# Patient Record
Sex: Male | Born: 1960 | Race: Asian | Hispanic: Yes | Marital: Married | Smoking: Former smoker
Health system: Southern US, Community
[De-identification: ages and names within clinical notes are randomized; demographics above are authoritative.]

## PROBLEM LIST (undated history)

## (undated) DIAGNOSIS — E119 Type 2 diabetes mellitus without complications: Secondary | ICD-10-CM

## (undated) DIAGNOSIS — D649 Anemia, unspecified: Secondary | ICD-10-CM

## (undated) DIAGNOSIS — I10 Essential (primary) hypertension: Secondary | ICD-10-CM

## (undated) HISTORY — PX: EYE SURGERY: SHX253

---

## 2004-06-29 ENCOUNTER — Emergency Department: Payer: Self-pay | Admitting: Emergency Medicine

## 2005-09-21 ENCOUNTER — Ambulatory Visit: Payer: Self-pay | Admitting: *Deleted

## 2011-10-25 ENCOUNTER — Emergency Department: Payer: Self-pay | Admitting: Emergency Medicine

## 2011-10-25 LAB — COMPREHENSIVE METABOLIC PANEL
Albumin: 4 g/dL (ref 3.4–5.0)
Alkaline Phosphatase: 67 U/L (ref 50–136)
Anion Gap: 11 (ref 7–16)
BUN: 20 mg/dL — ABNORMAL HIGH (ref 7–18)
Chloride: 102 mmol/L (ref 98–107)
Co2: 24 mmol/L (ref 21–32)
Creatinine: 0.82 mg/dL (ref 0.60–1.30)
EGFR (African American): >60
EGFR (Non-African Amer.): >60
Glucose: 195 mg/dL — ABNORMAL HIGH (ref 65–99)
Osmolality: 282 (ref 275–301)
SGOT(AST): 29 U/L (ref 15–37)
Sodium: 137 mmol/L (ref 136–145)

## 2011-10-25 LAB — CBC
HCT: 39.4 % — ABNORMAL LOW (ref 40.0–52.0)
HGB: 14 g/dL (ref 13.0–18.0)
MCH: 29.3 pg (ref 26.0–34.0)
MCV: 82 fL (ref 80–100)
Platelet: 123 10*3/uL — ABNORMAL LOW (ref 150–440)
RBC: 4.79 10*6/uL (ref 4.40–5.90)
WBC: 9.3 10*3/uL (ref 3.8–10.6)

## 2011-10-25 LAB — URINALYSIS, COMPLETE
Bacteria: NONE SEEN
Bilirubin,UR: NEGATIVE
Blood: NEGATIVE
Leukocyte Esterase: NEGATIVE
Nitrite: NEGATIVE
Ph: 5 (ref 4.5–8.0)
RBC,UR: NONE SEEN /HPF (ref 0–5)
Squamous Epithelial: NONE SEEN
WBC UR: <1 /HPF (ref 0–5)

## 2011-10-25 LAB — TROPONIN I: Troponin-I: <0.02 ng/mL

## 2014-05-21 ENCOUNTER — Ambulatory Visit
Admit: 2014-05-21 | Disposition: A | Discharge status: Home / Self Care | Payer: Self-pay | Attending: Ophthalmology | Admitting: Ophthalmology

## 2014-05-21 LAB — POTASSIUM: POTASSIUM: 4.3 mmol/L

## 2014-05-29 ENCOUNTER — Ambulatory Visit
Admit: 2014-05-29 | Disposition: A | Discharge status: Home / Self Care | Payer: Self-pay | Attending: Ophthalmology | Admitting: Ophthalmology

## 2014-06-08 NOTE — Op Note (Signed)
PATIENT NAME:  Ian Mccormick, SWOPES MR#:  [id number] DATE OF BIRTH:  1960-05-25  DATE OF PROCEDURE:  05/29/2014.  PREOPERATIVE DIAGNOSIS:  Nuclear sclerotic cataract, right eye, ICD-10 code H25.11.  POSTOPERATIVE DIAGNOSIS:  Nuclear sclerotic cataract, right eye, ICD-10 code H25.11  PROCEDURE:  Phacoemulsification with posterior chamber intraocular lens right eye, model SN60WF.  SURGEON:  Lyla Glassing, MD.  INDICATIONS:  This is a 54 year old male with decreased vision in the right eye.  PROCEDURE:  The risks and benefits of cataract surgery were discussed at length with the patient, including bleeding, infection, retinal detachment, re-operation, diplopia, ptosis, loss of vision, and loss of the eye. Informed consent was obtained. On the day of surgery, several sets of preoperative medication were administered to the operative eye including 0.5% tetracaine,1% cyclopentolate, 10% phenylephrine, 0.5% ketorolac, 0.5% gatifloxacin, and 2% lidocaine .  The patient was taken to the operating room and sedated via IV sedation. Topical tetracaine was placed in the eye. The operative eye was prepped using a dilute 10% Betadine solution and then covered in sterile drapes leaving only the operative eye exposed. A Lieberman lid speculum was placed to provide exposure. Using 0.12 forceps and a sideport blade, a paracentesis was created. Then a mixture of BSS, preservative free lidocaine, and epinephrine was injected into the anterior chamber. Next, a 2.4 mm keratome blade was used to create a two-step full-thickness clear corneal incision temporally. The cystitome and Utrata forceps were used to create a continuous capsulorrhexis in the anterior lens capsule. BSS on a hydrodissection cannula was used to perform gentle hydrodissection. Phacoemulsification was then performed to remove the nucleus. Irrigation and aspiration was performed to remove the remaining cortical material. Provisc was injected to fill the capsular  bag and anterior chamber. A 19.0-diopter SN60WF intraocular lens was injected into the capsular bag. The Connor wand was used to rotate it into proper position in the capsular bag. Irrigation and aspiration was performed to remove the remaining Viscoelastic material from the eye. BSS on a 30-gauge cannula was used to hydrate the wound. An intracameral antibiotic was administered. The wounds were checked and found to be watertight. The lid speculum and drapes were carefully removed. Several drops of Vigamox were placed in the operative eye. The eye was covered with protective eyewear. The patient was taken to the recovery area in good condition. There were no complications.   ____________________________ Lyla Glassing, MD nm:kc D: 05/29/2014 11:48:19 ET T: 05/29/2014 14:42:46 ET JOB#: [id number]  cc: Lyla Glassing, MD, <Dictator> Lyla Glassing MD ELECTRONICALLY SIGNED 06/05/2014 11:12

## 2015-02-12 ENCOUNTER — Emergency Department (HOSPITAL_COMMUNITY): Payer: BLUE CROSS/BLUE SHIELD

## 2015-02-12 ENCOUNTER — Emergency Department (HOSPITAL_COMMUNITY)
Admission: EM | Admit: 2015-02-12 | Discharge: 2015-02-12 | Disposition: A | Discharge status: Home / Self Care | Payer: BLUE CROSS/BLUE SHIELD | Attending: Emergency Medicine | Admitting: Emergency Medicine

## 2015-02-12 ENCOUNTER — Encounter (HOSPITAL_COMMUNITY): Payer: Self-pay | Admitting: *Deleted

## 2015-02-12 DIAGNOSIS — E119 Type 2 diabetes mellitus without complications: Secondary | ICD-10-CM | POA: Insufficient documentation

## 2015-02-12 DIAGNOSIS — J988 Other specified respiratory disorders: Secondary | ICD-10-CM

## 2015-02-12 DIAGNOSIS — I10 Essential (primary) hypertension: Secondary | ICD-10-CM | POA: Diagnosis not present

## 2015-02-12 DIAGNOSIS — B9789 Other viral agents as the cause of diseases classified elsewhere: Secondary | ICD-10-CM

## 2015-02-12 DIAGNOSIS — J029 Acute pharyngitis, unspecified: Secondary | ICD-10-CM | POA: Diagnosis present

## 2015-02-12 DIAGNOSIS — R059 Cough, unspecified: Secondary | ICD-10-CM

## 2015-02-12 DIAGNOSIS — J069 Acute upper respiratory infection, unspecified: Secondary | ICD-10-CM | POA: Diagnosis not present

## 2015-02-12 DIAGNOSIS — R05 Cough: Secondary | ICD-10-CM

## 2015-02-12 HISTORY — DX: Essential (primary) hypertension: I10

## 2015-02-12 HISTORY — DX: Type 2 diabetes mellitus without complications: E11.9

## 2015-02-12 LAB — URINALYSIS, ROUTINE W REFLEX MICROSCOPIC
BILIRUBIN URINE: NEGATIVE
Glucose, UA: >1000 mg/dL — AB
HGB URINE DIPSTICK: NEGATIVE
KETONES UR: NEGATIVE mg/dL
Leukocytes, UA: NEGATIVE
NITRITE: NEGATIVE
Protein, ur: 30 mg/dL — AB
SPECIFIC GRAVITY, URINE: 1.033 — AB (ref 1.005–1.030)
pH: 6 (ref 5.0–8.0)

## 2015-02-12 LAB — COMPREHENSIVE METABOLIC PANEL
ALK PHOS: 90 U/L (ref 38–126)
ALT: 27 U/L (ref 17–63)
AST: 27 U/L (ref 15–41)
Albumin: 3.4 g/dL — ABNORMAL LOW (ref 3.5–5.0)
Anion gap: 9 (ref 5–15)
BILIRUBIN TOTAL: 0.5 mg/dL (ref 0.3–1.2)
BUN: 18 mg/dL (ref 6–20)
CALCIUM: 9.5 mg/dL (ref 8.9–10.3)
CO2: 26 mmol/L (ref 22–32)
CREATININE: 0.89 mg/dL (ref 0.61–1.24)
Chloride: 101 mmol/L (ref 101–111)
Glucose, Bld: 300 mg/dL — ABNORMAL HIGH (ref 65–99)
Potassium: 4 mmol/L (ref 3.5–5.1)
Sodium: 136 mmol/L (ref 135–145)
Total Protein: 6.7 g/dL (ref 6.5–8.1)

## 2015-02-12 LAB — CBC WITH DIFFERENTIAL/PLATELET
BASOS ABS: 0.1 10*3/uL (ref 0.0–0.1)
Basophils Relative: 1 %
EOS PCT: 13 %
Eosinophils Absolute: 0.9 10*3/uL — ABNORMAL HIGH (ref 0.0–0.7)
HEMATOCRIT: 38.3 % — AB (ref 39.0–52.0)
HEMOGLOBIN: 13 g/dL (ref 13.0–17.0)
LYMPHS ABS: 2 10*3/uL (ref 0.7–4.0)
LYMPHS PCT: 28 %
MCH: 26 pg (ref 26.0–34.0)
MCHC: 33.9 g/dL (ref 30.0–36.0)
MCV: 76.6 fL — AB (ref 78.0–100.0)
Monocytes Absolute: 0.5 10*3/uL (ref 0.1–1.0)
Monocytes Relative: 7 %
NEUTROS ABS: 3.7 10*3/uL (ref 1.7–7.7)
Neutrophils Relative %: 51 %
Platelets: 140 10*3/uL — ABNORMAL LOW (ref 150–400)
RBC: 5 MIL/uL (ref 4.22–5.81)
RDW: 13.4 % (ref 11.5–15.5)
WBC: 7.1 10*3/uL (ref 4.0–10.5)

## 2015-02-12 LAB — URINE MICROSCOPIC-ADD ON

## 2015-02-12 MED ORDER — ALBUTEROL SULFATE HFA 108 (90 BASE) MCG/ACT IN AERS
2.0000 | INHALATION_SPRAY | Freq: Once | RESPIRATORY_TRACT | Status: AC
  Administered 2015-02-12: 2 via RESPIRATORY_TRACT
  Filled 2015-02-12: qty 6.7

## 2015-02-12 MED ORDER — IBUPROFEN 400 MG PO TABS
600.0000 mg | ORAL_TABLET | Freq: Once | ORAL | Status: AC
  Administered 2015-02-12: 600 mg via ORAL
  Filled 2015-02-12: qty 1

## 2015-02-12 NOTE — ED Notes (Signed)
PT ambulated with baseline gait; VSS; A&Ox3; no signs of distress; respirations even and unlabored; skin warm and dry; no questions upon discharge.  

## 2015-02-12 NOTE — ED Notes (Signed)
[place] interpreters used for questioning: pt reports sore throat, cough, headache x 2 weeks; states co-workers also exhibiting same symptoms; unknown if patient received flu immunization; pt reports headache is 3/10 but worse with coughing; productive cough noted with yellow phlem

## 2015-02-12 NOTE — ED Notes (Signed)
Per Spanish Interpreter: Pt to ED c/o headache, bodyaches, and sore throat x 2 weeks; has tried nyquil/dayquil without relief. Reports productive cough with clear and yellow phlegm. Denies fever or chlls

## 2015-02-12 NOTE — Discharge Instructions (Signed)
Tos en los adultos (Cough, Adult) La tos ayuda a limpiar la garganta y los pulmones. La tos puede durar solo 2 o 3semanas (aguda) o ms de 8semanas (crnica). Las causas de la tos son [place]. Puede ser el signo de [place] enfermedad o de otro trastorno. CUIDADOS EN EL HOGAR  Est atento a cualquier cambio en la tos.  Tome los medicamentos solamente como se lo haya indicado el mdico.  Si le recetaron un antibitico, tmelo como se lo haya indicado el mdico. No deje de tomarlo aunque comience a sentirse mejor.  Hable con el mdico antes de probar un medicamento para la tos.  Beba suficiente lquido para mantener el pis (orina) claro o de color amarillo plido.  Si el aire est seco, use un vaporizador o un humidificador con vapor fro en su casa.  Mantngase alejado de las cosas que lo hacen toser en el trabajo o en su casa.  Si la tos aumenta durante la noche, haga la prueba de usar almohadas adicionales para Theatre manager la cabeza ms elevada mientras duerme.  No fume e intente no estar cerca de humo. Si necesita ayuda para dejar de fumar, consulte al mdico.  No consuma cafena.  No beba alcohol.  Descanse todo lo que sea necesario. SOLICITE AYUDA SI:  Le aparecen problemas (sntomas) nuevos.  Expectora un lquido amarillento (pus) cuando tose.  La tos no mejora despus de 2 o 3semanas, o empeora.  Los medicamentos no [employer] tos, y no Programmer, applications.  Siente un dolor que se vuelve ms intenso o que no se [employer].  Tiene fiebre.  Est bajando de peso y no sabe por qu.  Tiene transpiracin nocturna. SOLICITE AYUDA DE INMEDIATO SI:  Tose y escupe sangre.  Tiene dificultad para respirar.  Los latidos cardacos son muy rpidos.   Esta informacin no tiene Marine scientist el consejo del mdico. Asegrese de hacerle al mdico cualquier pregunta que tenga.   Document Released: 10/07/2010 Document Revised: 10/15/2014 Elsevier Interactive  Patient Education 2016 [place]  (Viral Infections)  Un virus es un tipo de germen. Puede causar:   Dolor de garganta leve.  Dolores musculares.  Dolor de [place].  Secrecin nasal.  Erupciones.  Lagrimeo.  Cansancio.  Tos.  Prdida del apetito.  Ganas de vomitar (nuseas).  Vmitos.  Materia fecal lquida (diarrea). CUIDADOS EN EL HOGAR   Tome la medicacin slo como le haya indicado el mdico.  Beba gran cantidad de lquido para mantener la orina de tono claro o color amarillo plido. Las bebidas deportivas son Pamala Hurry eleccin.  Descanse lo suficiente y [employer]. Puede tomar sopas y caldos con crackers o arroz. SOLICITE AYUDA DE INMEDIATO SI:   Siente un dolor de cabeza muy intenso.  Le falta el aire.  Tiene dolor en el pecho o en el cuello.  Tiene una erupcin que no tena antes.  No puede detener los vmitos.  Tiene una hemorragia que no se detiene.  No puede retener los lquidos.  Usted o el nio tienen una temperatura oral le sube a ms de 38,9 C (102 F), y no puede bajarla con medicamentos.  Su beb tiene ms de 3 meses y su temperatura rectal es de 102 F (38.9 C) o ms.  Su beb tiene 3 meses o menos y su temperatura rectal es de 100.4 F (38 C) o ms. ASEGRESE DE QUE:   Comprende estas instrucciones.  Controlar la enfermedad.  Solicitar  ayuda de inmediato si no mejora o si empeora.   Esta informacin no tiene Marine scientist el consejo del mdico. Asegrese de hacerle al mdico cualquier pregunta que tenga.   Document Released: 06/28/2010 Document Revised: 04/18/2011 Elsevier Interactive Patient Education [employer].

## 2015-02-12 NOTE — ED Notes (Signed)
Pt ambulatory to room B14 with steady gait

## 2015-02-12 NOTE — ED Provider Notes (Signed)
CSN: [id number]     Arrival date & time 02/12/15  0534 History   First MD Initiated Contact with Patient 02/12/15 [phone]     Chief Complaint  Patient presents with  . Sore Throat  . Headache  . Cough     (Consider location/radiation/quality/duration/timing/severity/associated sxs/prior Treatment) HPI  History is obtained via [place] interpreter. 55 year old male with history of HTN and DM who presents with cough, sore throat, and headaches. Two weeks ago with progressive symptoms, including myalgias, productive cough, congestions, sore throat, runny nose, and intermittent HA. Nausea and intermittent abdominal discomfort. No vomiting or diarrhea. No chest pain or difficulty breathing. Not improving so presented to ED for evaluation. Urinating frequenting but no dysuria, fever, or chills. Multiple people at work with similar illness.  Past Medical History  Diagnosis Date  . Diabetes mellitus without complication ([place])   . Hypertension    Past Surgical History  Procedure Laterality Date  . Eye surgery     History reviewed. No pertinent family history. Social History  Substance Use Topics  . Smoking status: Never Smoker   . Smokeless tobacco: None  . Alcohol Use: No    Review of Systems 10/14 systems reviewed and are negative other than those stated in the HPI    Allergies  Review of patient's allergies indicates no known allergies.  Home Medications   Prior to Admission medications   Not on File   BP 124/73 mmHg  Pulse 66  Temp(Src) 98 F (36.7 C)  Resp 16  Ht 5\' 5"  (1.651 m)  Wt 170 lb (77.111 kg)  BMI 28.29 kg/m2  SpO2 99% Physical Exam Physical Exam  Nursing note and vitals reviewed. Constitutional: Well developed, well nourished, non-toxic, and in no acute distress Head: Normocephalic and atraumatic.  Mouth/Throat: Oropharynx is erythematous but no swelling or lesions. Mucous membranes moist.  Neck: Normal range of motion. Neck supple.  no  meningismus. Cardiovascular: Normal rate and regular rhythm.   Pulmonary/Chest: Effort normal and breath sounds normal. Bronchospastic cough. Abdominal: Soft. There is no tenderness. There is no rebound and no guarding.  Musculoskeletal: Normal range of motion.  Neurological: Alert, no facial droop, fluent speech, moves all extremities symmetrically Skin: Skin is warm and dry.  Psychiatric: Cooperative  ED Course  Procedures (including critical care time) Labs Review Labs Reviewed  CBC WITH DIFFERENTIAL/PLATELET - Abnormal; Notable for the following:    HCT 38.3 (*)    MCV 76.6 (*)    Platelets 140 (*)    Eosinophils Absolute 0.9 (*)    All other components within normal limits  COMPREHENSIVE METABOLIC PANEL - Abnormal; Notable for the following:    Glucose, Bld 300 (*)    Albumin 3.4 (*)    All other components within normal limits  URINALYSIS, ROUTINE W REFLEX MICROSCOPIC (NOT AT [facility]) - Abnormal; Notable for the following:    Specific Gravity, Urine 1.033 (*)    Glucose, UA >1000 (*)    Protein, ur 30 (*)    All other components within normal limits  URINE MICROSCOPIC-ADD ON - Abnormal; Notable for the following:    Squamous Epithelial / LPF 0-5 (*)    Bacteria, UA RARE (*)    All other components within normal limits    Imaging Review Dg Chest 2 View  02/12/2015  CLINICAL DATA:  Subacute onset of cough.  Initial encounter. EXAM: CHEST  2 VIEW COMPARISON:  None. FINDINGS: The lungs are well-aerated. Mild peribronchial thickening is noted. There is  no evidence of focal opacification, pleural effusion or pneumothorax. The heart is normal in size; the mediastinal contour is within normal limits. No acute osseous abnormalities are seen. IMPRESSION: Mild peribronchial thickening noted.  Lungs otherwise clear. Electronically Signed   By: Garald Balding M.D.   On: 02/12/2015 06:28   I have personally reviewed and evaluated these images and lab results as part of my medical  decision-making.   EKG Interpretation None      MDM   Final diagnoses:  Cough  Viral respiratory illness    55 year old male with history of hypertension and diabetes who presents with a 2 weeks of progressive cough, congestion, sore throat and runny nose. Vital signs are stable on arrival, and he is well-appearing and in no acute distress. Exam overall unremarkable. X-ray without evidence of pneumonia but mild peribronchial thickening. Overall presentation is consistent with likely viral respiratory illness. Basic blood work overall unremarkable aside from hyperglycemia without evidence of DKA or other complications. Has been compliant with medications, and may have hyperglycemia due to recent illness. He has a PCP so will follow-up regarding his blood glucose and repeat evaluation. Discussed supportive care for home. Strict return and follow-up instructions are reviewed. These are all reviewed via a Spanish interpreter. He expressed understanding of all discharge instructions and felt comfortable to plan of care.    Forde Dandy, MD 02/12/15 1046

## 2016-12-06 ENCOUNTER — Ambulatory Visit: Payer: Self-pay

## 2020-02-15 ENCOUNTER — Other Ambulatory Visit: Payer: BC Managed Care – PPO

## 2020-02-15 DIAGNOSIS — Z20822 Contact with and (suspected) exposure to covid-19: Secondary | ICD-10-CM

## 2020-02-17 LAB — SARS-COV-2, NAA 2 DAY TAT

## 2020-02-17 LAB — NOVEL CORONAVIRUS, NAA: SARS-CoV-2, NAA: DETECTED — AB

## 2020-02-21 ENCOUNTER — Telehealth: Payer: Self-pay

## 2020-02-21 NOTE — Telephone Encounter (Signed)
See lab note. COVID-19 positive result.

## 2020-08-27 ENCOUNTER — Inpatient Hospital Stay: Payer: BC Managed Care – PPO | Admitting: Oncology

## 2020-08-27 ENCOUNTER — Inpatient Hospital Stay: Payer: BC Managed Care – PPO

## 2020-09-01 ENCOUNTER — Encounter: Payer: Self-pay | Admitting: Oncology

## 2020-09-01 ENCOUNTER — Inpatient Hospital Stay: Payer: BC Managed Care – PPO

## 2020-09-01 ENCOUNTER — Other Ambulatory Visit: Payer: Self-pay

## 2020-09-01 ENCOUNTER — Other Ambulatory Visit: Payer: Self-pay | Admitting: *Deleted

## 2020-09-01 ENCOUNTER — Inpatient Hospital Stay: Payer: BC Managed Care – PPO | Attending: Oncology | Admitting: Oncology

## 2020-09-01 DIAGNOSIS — D509 Iron deficiency anemia, unspecified: Secondary | ICD-10-CM

## 2020-09-01 DIAGNOSIS — Z87891 Personal history of nicotine dependence: Secondary | ICD-10-CM | POA: Diagnosis not present

## 2020-09-01 DIAGNOSIS — E785 Hyperlipidemia, unspecified: Secondary | ICD-10-CM | POA: Diagnosis not present

## 2020-09-01 DIAGNOSIS — E7800 Pure hypercholesterolemia, unspecified: Secondary | ICD-10-CM | POA: Insufficient documentation

## 2020-09-01 DIAGNOSIS — D472 Monoclonal gammopathy: Secondary | ICD-10-CM

## 2020-09-01 DIAGNOSIS — D649 Anemia, unspecified: Secondary | ICD-10-CM | POA: Diagnosis not present

## 2020-09-01 DIAGNOSIS — I10 Essential (primary) hypertension: Secondary | ICD-10-CM | POA: Diagnosis not present

## 2020-09-01 DIAGNOSIS — E119 Type 2 diabetes mellitus without complications: Secondary | ICD-10-CM | POA: Insufficient documentation

## 2020-09-01 LAB — IRON AND TIBC
Iron: 43 ug/dL — ABNORMAL LOW (ref 45–182)
Saturation Ratios: 10 % — ABNORMAL LOW (ref 17.9–39.5)
TIBC: 440 ug/dL (ref 250–450)
UIBC: 397 ug/dL

## 2020-09-01 LAB — CBC WITH DIFFERENTIAL/PLATELET
Abs Immature Granulocytes: 0.05 10*3/uL (ref 0.00–0.07)
Basophils Absolute: 0.1 10*3/uL (ref 0.0–0.1)
Basophils Relative: 1 %
Eosinophils Absolute: 0.7 10*3/uL — ABNORMAL HIGH (ref 0.0–0.5)
Eosinophils Relative: 7 %
HCT: 35.3 % — ABNORMAL LOW (ref 39.0–52.0)
Hemoglobin: 11 g/dL — ABNORMAL LOW (ref 13.0–17.0)
Immature Granulocytes: 1 %
Lymphocytes Relative: 23 %
Lymphs Abs: 2.1 10*3/uL (ref 0.7–4.0)
MCH: 22.7 pg — ABNORMAL LOW (ref 26.0–34.0)
MCHC: 31.2 g/dL (ref 30.0–36.0)
MCV: 72.9 fL — ABNORMAL LOW (ref 80.0–100.0)
Monocytes Absolute: 0.6 10*3/uL (ref 0.1–1.0)
Monocytes Relative: 6 %
Neutro Abs: 5.8 10*3/uL (ref 1.7–7.7)
Neutrophils Relative %: 62 %
Platelets: 197 10*3/uL (ref 150–400)
RBC: 4.84 MIL/uL (ref 4.22–5.81)
RDW: 15.4 % (ref 11.5–15.5)
WBC: 9.2 10*3/uL (ref 4.0–10.5)
nRBC: 0 % (ref 0.0–0.2)

## 2020-09-01 LAB — COMPREHENSIVE METABOLIC PANEL
ALT: 18 U/L (ref 0–44)
AST: 21 U/L (ref 15–41)
Albumin: 4.1 g/dL (ref 3.5–5.0)
Alkaline Phosphatase: 72 U/L (ref 38–126)
Anion gap: 9 (ref 5–15)
BUN: 30 mg/dL — ABNORMAL HIGH (ref 8–23)
CO2: 24 mmol/L (ref 22–32)
Calcium: 9.5 mg/dL (ref 8.9–10.3)
Chloride: 100 mmol/L (ref 98–111)
Creatinine, Ser: 1.42 mg/dL — ABNORMAL HIGH (ref 0.61–1.24)
GFR, Estimated: 56 mL/min — ABNORMAL LOW (ref >60)
Glucose, Bld: 112 mg/dL — ABNORMAL HIGH (ref 70–99)
Potassium: 3.9 mmol/L (ref 3.5–5.1)
Sodium: 133 mmol/L — ABNORMAL LOW (ref 135–145)
Total Bilirubin: 0.3 mg/dL (ref 0.3–1.2)
Total Protein: 7.5 g/dL (ref 6.5–8.1)

## 2020-09-01 LAB — TSH: TSH: 4.055 u[IU]/mL (ref 0.350–4.500)

## 2020-09-01 LAB — FERRITIN: Ferritin: 7 ng/mL — ABNORMAL LOW (ref 24–336)

## 2020-09-01 LAB — FOLATE: Folate: 21 ng/mL (ref >5.9)

## 2020-09-01 LAB — RETICULOCYTES
Immature Retic Fract: 22.7 % — ABNORMAL HIGH (ref 2.3–15.9)
RBC.: 4.89 MIL/uL (ref 4.22–5.81)
Retic Count, Absolute: 68 10*3/uL (ref 19.0–186.0)
Retic Ct Pct: 1.4 % (ref 0.4–3.1)

## 2020-09-01 LAB — VITAMIN B12: Vitamin B-12: 271 pg/mL (ref 180–914)

## 2020-09-01 NOTE — Progress Notes (Signed)
Hematology/Oncology Consult note [facility] [phone] [phone]  Patient Care Team: Frazier Richards, MD as PCP - General (Family Medicine)   Name of the patient: Ian Mccormick  [id number]  1960-07-04    Reason for referral-anemia   Referring physician- Beverlyn Roux  Date of visit: 09/01/20   History of presenting illness-history obtained with the help of a Spanish interpreter.  Patient is a 60 year old Hispanic male with a past medical history significant for hypertension hyperlipidemia and type 2 diabetes and CKD.  He has been referred to [place] for anemia.Most recent CBC from July 2022 showed white count of 8.7, H&H of 10.9/35.6 with an MCV of 75.  I do not have any iron studies.  Creatinine mildly elevated at 1.2.  Patient denies any bleeding in his stool or urine.  Denies any dark melanotic stools.  Denies any consistent use of NSAIDs Goody powder or BC powder.  Denies any family history of colon cancer.  He has never had a colonoscopy in the past.  Currently reports ongoing fatigue.  Denies any changes in his appetite or weight.  Reports ongoing constipation  ECOG PS- 0  Pain scale- 0   Review of systems- Review of Systems  Constitutional:  Positive for malaise/fatigue. Negative for chills, fever and weight loss.  HENT:  Negative for congestion, ear discharge and nosebleeds.   Eyes:  Negative for blurred vision.  Respiratory:  Negative for cough, hemoptysis, sputum production, shortness of breath and wheezing.   Cardiovascular:  Negative for chest pain, palpitations, orthopnea and claudication.  Gastrointestinal:  Negative for abdominal pain, blood in stool, constipation, diarrhea, heartburn, melena, nausea and vomiting.  Genitourinary:  Negative for dysuria, flank pain, frequency, hematuria and urgency.  Musculoskeletal:  Negative for back pain, joint pain and myalgias.  Skin:  Negative for rash.  Neurological:  Negative for dizziness,  tingling, focal weakness, seizures, weakness and headaches.  Endo/Heme/Allergies:  Does not bruise/bleed easily.  Psychiatric/Behavioral:  Negative for depression and suicidal ideas. The patient does not have insomnia.    No Known Allergies  Patient Active Problem List   Diagnosis Date Noted   Diabetes ([place]) 09/01/2020   Essential hypertension 09/01/2020   High cholesterol 09/01/2020     Past Medical History:  Diagnosis Date   Diabetes mellitus without complication ([place])    Hypertension      Past Surgical History:  Procedure Laterality Date   EYE SURGERY      Social History   Socioeconomic History   Marital status: Married    Spouse name: Not on file   Number of children: Not on file   Years of education: Not on file   Highest education level: Not on file  Occupational History   Not on file  Tobacco Use   Smoking status: Former    Packs/day: 1.00    Years: 4.00    Pack years: 4.00    Types: Cigarettes    Quit date: 2000    Years since quitting: 22.5   Smokeless tobacco: Never  Vaping Use   Vaping Use: Former  Substance and Sexual Activity   Alcohol use: No   Drug use: No   Sexual activity: Not on file  Other Topics Concern   Not on file  Social History Narrative   Not on file   Social Determinants of Health   Financial Resource Strain: Not on file  Food Insecurity: Not on file  Transportation Needs: Not on file  Physical Activity: Not  on file  Stress: Not on file  Social Connections: Not on file  Intimate Partner Violence: Not on file     Family History  Problem Relation Age of Onset   Hypertension Mother    Diabetes Mother      Current Outpatient Medications:    aspirin EC 81 MG tablet, Take 81 mg by mouth daily. Swallow whole., Disp: , Rfl:    hydrochlorothiazide (HYDRODIURIL) 25 MG tablet, Take 25 mg by mouth daily., Disp: , Rfl:    JARDIANCE 25 MG TABS tablet, Take 25 mg by mouth daily., Disp: , Rfl:    losartan (COZAAR) 100 MG tablet,  Take 100 mg by mouth daily., Disp: , Rfl:    metFORMIN (GLUCOPHAGE) 1000 MG tablet, Take 1,000 mg by mouth 2 (two) times daily., Disp: , Rfl:    metoprolol (TOPROL-XL) 200 MG 24 hr tablet, Take 200 mg by mouth daily., Disp: , Rfl:    rosuvastatin (CRESTOR) 20 MG tablet, SMARTSIG:1 Tablet(s) By Mouth Every Evening, Disp: , Rfl:    Physical exam:  Vitals:   09/01/20 1335  BP: (!) 183/88  Pulse: (!) 59  Resp: 20  Temp: 97.9 F (36.6 C)  TempSrc: Tympanic  SpO2: 100%  Weight: 176 lb 14.4 oz (80.2 kg)   Physical Exam Constitutional:      General: He is not in acute distress. Cardiovascular:     Rate and Rhythm: Normal rate and regular rhythm.     Heart sounds: Normal heart sounds.  Pulmonary:     Effort: Pulmonary effort is normal.     Breath sounds: Normal breath sounds.  Abdominal:     General: Bowel sounds are normal.     Palpations: Abdomen is soft.  Skin:    General: Skin is warm and dry.  Neurological:     Mental Status: He is alert and oriented to person, place, and time.       CMP Latest Ref Rng & Units 09/01/2020  Glucose 70 - 99 mg/dL 112(H)  BUN 8 - 23 mg/dL 30(H)  Creatinine 0.61 - 1.24 mg/dL 1.42(H)  Sodium 135 - 145 mmol/L 133(L)  Potassium 3.5 - 5.1 mmol/L 3.9  Chloride 98 - 111 mmol/L 100  CO2 22 - 32 mmol/L 24  Calcium 8.9 - 10.3 mg/dL 9.5  Total Protein 6.5 - 8.1 g/dL 7.5  Total Bilirubin 0.3 - 1.2 mg/dL 0.3  Alkaline Phos 38 - 126 U/L 72  AST 15 - 41 U/L 21  ALT 0 - 44 U/L 18   CBC Latest Ref Rng & Units 09/01/2020  WBC 4.0 - 10.5 K/uL 9.2  Hemoglobin 13.0 - 17.0 g/dL 11.0(L)  Hematocrit 39.0 - 52.0 % 35.3(L)  Platelets 150 - 400 K/uL 197    Assessment and plan- Patient is a 60 y.o. male referred for normocytic anemia  Based on patient's recent blood work he has mild to moderate anemia with borderline microcytosis concerning for iron deficiency.Today I will do a complete anemia work-up including a CBC, CMP, ferritin and iron studies B12 and  folate, myeloma panel reticulocyte count TSH and haptoglobin.  If he has evidence of iron deficiency we can offer him IV iron.  Patient has never had a colonoscopy and I will refer him to GI as well if he has evidence of iron deficiency.  I will see him in person in 3 weeks time to discuss the results of blood work.  Patient also has a history of MGUS but I cannot find any previous  SPEP.  I will order a myeloma panel and free light chains.   Thank you for this kind referral and the opportunity to participate in the care of this patient   Visit Diagnosis 1. Microcytic anemia   2. MGUS (monoclonal gammopathy of unknown significance)     Dr. Randa Evens, MD, MPH [facility] [id number] 09/01/2020

## 2020-09-02 ENCOUNTER — Telehealth: Payer: Self-pay

## 2020-09-02 ENCOUNTER — Encounter: Payer: Self-pay | Admitting: *Deleted

## 2020-09-02 LAB — MULTIPLE MYELOMA PANEL, SERUM
Albumin SerPl Elph-Mcnc: 3.7 g/dL (ref 2.9–4.4)
Albumin/Glob SerPl: 1.1 (ref 0.7–1.7)
Alpha 1: 0.2 g/dL (ref 0.0–0.4)
Alpha2 Glob SerPl Elph-Mcnc: 1.1 g/dL — ABNORMAL HIGH (ref 0.4–1.0)
B-Globulin SerPl Elph-Mcnc: 1.4 g/dL — ABNORMAL HIGH (ref 0.7–1.3)
Gamma Glob SerPl Elph-Mcnc: 0.8 g/dL (ref 0.4–1.8)
Globulin, Total: 3.5 g/dL (ref 2.2–3.9)
IgA: 714 mg/dL — ABNORMAL HIGH (ref 61–437)
IgG (Immunoglobin G), Serum: 878 mg/dL (ref 603–1613)
IgM (Immunoglobulin M), Srm: 63 mg/dL (ref 20–172)
M Protein SerPl Elph-Mcnc: 0.4 g/dL — ABNORMAL HIGH
Total Protein ELP: 7.2 g/dL (ref 6.0–8.5)

## 2020-09-02 LAB — KAPPA/LAMBDA LIGHT CHAINS
Kappa free light chain: 69.2 mg/L — ABNORMAL HIGH (ref 3.3–19.4)
Kappa, lambda light chain ratio: 1.38 (ref 0.26–1.65)
Lambda free light chains: 50.1 mg/L — ABNORMAL HIGH (ref 5.7–26.3)

## 2020-09-02 LAB — HAPTOGLOBIN: Haptoglobin: 205 mg/dL (ref 32–363)

## 2020-09-02 NOTE — Telephone Encounter (Signed)
Contacted pt to inform him that GI referral was sent and their office will be contacting him in order to make appt. Pt did not answer but left VM explaining details as well as, provided future appt details. Left a call back phone number in case pt has any questions or concerns.

## 2020-09-16 ENCOUNTER — Ambulatory Visit: Payer: BC Managed Care – PPO | Admitting: Gastroenterology

## 2020-09-16 ENCOUNTER — Telehealth: Payer: Self-pay | Admitting: Gastroenterology

## 2020-09-16 NOTE — Telephone Encounter (Signed)
Called patient to reschedule appt and patient wants a call back from the [place] .Patient wants to discuss results and is feeling very weak.

## 2020-09-16 NOTE — Telephone Encounter (Signed)
Called patient back and he wanted me to give him lab results. However, I told him that he would have to call Dr. Elroy Channel office to get his lab results. Patient understood and had no further questions.

## 2020-09-23 ENCOUNTER — Telehealth: Payer: BC Managed Care – PPO | Admitting: Oncology

## 2020-09-27 NOTE — Progress Notes (Addendum)
Hematology/Oncology Consult note [facility] Telephone:([phone] Fax:([phone]  Patient Care Team: Frazier Richards, MD as PCP - General (Family Medicine)   Name of the patient: Ian Mccormick  [id number]  May 10, 1960    Reason for referral-anemia   Referring physician- Beverlyn Roux  Date of visit: 09/29/20  History of presenting illness-Patient is a 60 year old Hispanic male with a past medical history significant for hypertension hyperlipidemia and type 2 diabetes and CKD.  He has been referred to [place] for anemia.  Patient denies any bleeding in his stool or urine.  Denies any dark melanotic stools.  Denies any consistent use of NSAIDs Goody powder or BC powder.  Denies any family history of colon cancer.  He has never had a colonoscopy in the past.  Currently reports ongoing fatigue.  Denies any changes in his appetite or weight.  Reports ongoing constipation  Interval History-patient was evaluated by Dr. Janese Banks approximately 3 weeks ago to discuss abnormal lab values.  She completed a thorough anemia work-up and he is here today to discuss results.  He also has history of MGUS but she was unable to find previous lab results.  This was also repeated.  Today, he states that he has been feeling bloated and having pain on his right side that radiates to his back intermittently.  It is not reproducible.  He also reports intermittent constipation but is currently not taking any medications for this.  He was referred to gastroenterology whom he sees on 10/05/2020.  ECOG PS- 0  Pain scale- 0  Review of systems- Review of Systems  Constitutional: Negative.  Negative for chills, fever, malaise/fatigue and weight loss.  HENT:  Negative for congestion, ear pain and tinnitus.   Eyes: Negative.  Negative for blurred vision and double vision.  Respiratory: Negative.  Negative for cough, sputum production and shortness of breath.   Cardiovascular: Negative.  Negative for chest  pain, palpitations and leg swelling.  Gastrointestinal:  Positive for constipation. Negative for abdominal pain, diarrhea, nausea and vomiting.       Abdominal bloating and tenderness-right upper quadrant that radiates to his back  Genitourinary:  Negative for dysuria, frequency and urgency.  Musculoskeletal:  Negative for back pain and falls.  Skin: Negative.  Negative for rash.  Neurological: Negative.  Negative for weakness and headaches.  Endo/Heme/Allergies: Negative.  Does not bruise/bleed easily.  Psychiatric/Behavioral: Negative.  Negative for depression. The patient is not nervous/anxious and does not have insomnia.    No Known Allergies  Patient Active Problem List   Diagnosis Date Noted   Diabetes ([place]) 09/01/2020   Essential hypertension 09/01/2020   High cholesterol 09/01/2020     Past Medical History:  Diagnosis Date   Diabetes mellitus without complication ([place])    Hypertension      Past Surgical History:  Procedure Laterality Date   EYE SURGERY      Social History   Socioeconomic History   Marital status: Married    Spouse name: Not on file   Number of children: Not on file   Years of education: Not on file   Highest education level: Not on file  Occupational History   Not on file  Tobacco Use   Smoking status: Former    Packs/day: 1.00    Years: 4.00    Pack years: 4.00    Types: Cigarettes    Quit date: 2000    Years since quitting: 22.6   Smokeless tobacco: Never  Vaping Use  Vaping Use: Former  Substance and Sexual Activity   Alcohol use: No   Drug use: No   Sexual activity: Not on file  Other Topics Concern   Not on file  Social History Narrative   Not on file   Social Determinants of Health   Financial Resource Strain: Not on file  Food Insecurity: Not on file  Transportation Needs: Not on file  Physical Activity: Not on file  Stress: Not on file  Social Connections: Not on file  Intimate Partner Violence: Not on file      Family History  Problem Relation Age of Onset   Hypertension Mother    Diabetes Mother      Current Outpatient Medications:    aspirin EC 81 MG tablet, Take 81 mg by mouth daily. Swallow whole., Disp: , Rfl:    hydrochlorothiazide (HYDRODIURIL) 25 MG tablet, Take 25 mg by mouth daily., Disp: , Rfl:    JARDIANCE 25 MG TABS tablet, Take 25 mg by mouth daily., Disp: , Rfl:    losartan (COZAAR) 100 MG tablet, Take 100 mg by mouth daily., Disp: , Rfl:    metFORMIN (GLUCOPHAGE) 1000 MG tablet, Take 1,000 mg by mouth 2 (two) times daily., Disp: , Rfl:    metoprolol (TOPROL-XL) 200 MG 24 hr tablet, Take 200 mg by mouth daily., Disp: , Rfl:    rosuvastatin (CRESTOR) 20 MG tablet, SMARTSIG:1 Tablet(s) By Mouth Every Evening, Disp: , Rfl:    Physical exam:  Vitals:   09/28/20 0927  BP: (!) 167/74  Pulse: 62  Weight: 177 lb 1.6 oz (80.3 kg)   Physical Exam Constitutional:      General: He is not in acute distress. Cardiovascular:     Rate and Rhythm: Normal rate and regular rhythm.     Heart sounds: Normal heart sounds.  Pulmonary:     Effort: Pulmonary effort is normal.     Breath sounds: Normal breath sounds.  Abdominal:     General: Bowel sounds are normal.     Palpations: Abdomen is soft.  Skin:    General: Skin is warm and dry.  Neurological:     Mental Status: He is alert and oriented to person, place, and time.       CMP Latest Ref Rng & Units 09/01/2020  Glucose 70 - 99 mg/dL 112(H)  BUN 8 - 23 mg/dL 30(H)  Creatinine 0.61 - 1.24 mg/dL 1.42(H)  Sodium 135 - 145 mmol/L 133(L)  Potassium 3.5 - 5.1 mmol/L 3.9  Chloride 98 - 111 mmol/L 100  CO2 22 - 32 mmol/L 24  Calcium 8.9 - 10.3 mg/dL 9.5  Total Protein 6.5 - 8.1 g/dL 7.5  Total Bilirubin 0.3 - 1.2 mg/dL 0.3  Alkaline Phos 38 - 126 U/L 72  AST 15 - 41 U/L 21  ALT 0 - 44 U/L 18   CBC Latest Ref Rng & Units 09/01/2020  WBC 4.0 - 10.5 K/uL 9.2  Hemoglobin 13.0 - 17.0 g/dL 11.0(L)  Hematocrit 39.0 - 52.0 % 35.3(L)   Platelets 150 - 400 K/uL 197   Assessment and plan- Patient is a 60 y.o. male who is here for follow-up and to review results of work-up for his anemia.  Iron deficiency anemia- Unclear etiology.  Labs are consistent with iron deficiency anemia-hemoglobin on 09/01/2020 was 11, MCV 72.9, ferritin 7 with iron saturation of 10%.  TSH, vitamin B12 are normal.  Haptoglobin is normal.  Proceed with 5 doses of IV iron.  Patient is  reluctant to try oral iron d/t side effects given underlying constipation.  We will have him return to clinic in 3 months with labs, see Dr. Janese Banks and possible IV Venofer.  Constipation- Recommend he try Colace over-the-counter daily prn.  Abdominal bloating/tenderness- He is scheduled to see Dr. Vicente Males (GI) on 10/05/2020.  We will reach out to Dr. Vicente Males to make sure he does not want any imaging prior to his visit.  No palpable organomegaly on examination today.  Tenderness is nonreproducible.  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  M protein 0.4.  Kappa lambda light chain ratio is normal at 1.38.  Calcium is normal, creatinine slightly elevated and he is anemic.  Denies any bone pain.  Plan to have him hydrate and correct iron deficiency to see if numbers improve.   Elevated creatinine- Previous creatinines have been normal.  Recommend hydration and we will recheck in 4 weeks.   I spent 25 minutes dedicated to the care of this patient (face-to-face and non-face-to-face) on the date of the encounter to include what is described in the assessment and plan.  Thank you for this kind referral and the opportunity to participate in the care of this patient   Visit Diagnosis 1. Iron deficiency anemia, unspecified iron deficiency anemia type   2. MGUS (monoclonal gammopathy of unknown significance)   3. Constipation, unspecified constipation type   4. Elevated serum creatinine   5. Abdominal bloating     Faythe Casa,  NP 09/29/2020 12:51 PM

## 2020-09-28 ENCOUNTER — Encounter: Payer: Self-pay | Admitting: Oncology

## 2020-09-28 ENCOUNTER — Inpatient Hospital Stay: Payer: BC Managed Care – PPO | Attending: Oncology | Admitting: Oncology

## 2020-09-28 ENCOUNTER — Inpatient Hospital Stay: Payer: BC Managed Care – PPO | Admitting: Oncology

## 2020-09-28 VITALS — BP 167/74 | HR 62 | Wt 177.1 lb

## 2020-09-28 DIAGNOSIS — E119 Type 2 diabetes mellitus without complications: Secondary | ICD-10-CM | POA: Insufficient documentation

## 2020-09-28 DIAGNOSIS — E785 Hyperlipidemia, unspecified: Secondary | ICD-10-CM | POA: Insufficient documentation

## 2020-09-28 DIAGNOSIS — R7989 Other specified abnormal findings of blood chemistry: Secondary | ICD-10-CM | POA: Insufficient documentation

## 2020-09-28 DIAGNOSIS — K5900 Constipation, unspecified: Secondary | ICD-10-CM | POA: Diagnosis not present

## 2020-09-28 DIAGNOSIS — D472 Monoclonal gammopathy: Secondary | ICD-10-CM | POA: Insufficient documentation

## 2020-09-28 DIAGNOSIS — Z833 Family history of diabetes mellitus: Secondary | ICD-10-CM | POA: Diagnosis not present

## 2020-09-28 DIAGNOSIS — Z79899 Other long term (current) drug therapy: Secondary | ICD-10-CM | POA: Insufficient documentation

## 2020-09-28 DIAGNOSIS — E7800 Pure hypercholesterolemia, unspecified: Secondary | ICD-10-CM | POA: Diagnosis not present

## 2020-09-28 DIAGNOSIS — Z87891 Personal history of nicotine dependence: Secondary | ICD-10-CM | POA: Diagnosis not present

## 2020-09-28 DIAGNOSIS — Z8249 Family history of ischemic heart disease and other diseases of the circulatory system: Secondary | ICD-10-CM | POA: Diagnosis not present

## 2020-09-28 DIAGNOSIS — D509 Iron deficiency anemia, unspecified: Secondary | ICD-10-CM | POA: Diagnosis present

## 2020-09-28 DIAGNOSIS — I10 Essential (primary) hypertension: Secondary | ICD-10-CM | POA: Insufficient documentation

## 2020-09-28 DIAGNOSIS — R140 Abdominal distension (gaseous): Secondary | ICD-10-CM

## 2020-09-28 DIAGNOSIS — R5383 Other fatigue: Secondary | ICD-10-CM | POA: Diagnosis not present

## 2020-09-30 ENCOUNTER — Other Ambulatory Visit: Payer: Self-pay | Admitting: Oncology

## 2020-09-30 DIAGNOSIS — R7989 Other specified abnormal findings of blood chemistry: Secondary | ICD-10-CM

## 2020-10-02 ENCOUNTER — Other Ambulatory Visit: Payer: Self-pay | Admitting: Oncology

## 2020-10-02 DIAGNOSIS — D509 Iron deficiency anemia, unspecified: Secondary | ICD-10-CM

## 2020-10-05 ENCOUNTER — Telehealth: Payer: Self-pay | Admitting: Gastroenterology

## 2020-10-05 ENCOUNTER — Inpatient Hospital Stay: Payer: BC Managed Care – PPO

## 2020-10-05 ENCOUNTER — Telehealth: Payer: Self-pay

## 2020-10-05 ENCOUNTER — Ambulatory Visit: Payer: BC Managed Care – PPO | Admitting: Gastroenterology

## 2020-10-05 ENCOUNTER — Encounter: Payer: Self-pay | Admitting: Gastroenterology

## 2020-10-05 ENCOUNTER — Other Ambulatory Visit: Payer: Self-pay

## 2020-10-05 VITALS — BP 108/65 | HR 57 | Temp 96.6°F | Resp 18

## 2020-10-05 DIAGNOSIS — D509 Iron deficiency anemia, unspecified: Secondary | ICD-10-CM

## 2020-10-05 MED ORDER — SODIUM CHLORIDE 0.9 % IV SOLN: 200.0000 mg | Freq: Once | INTRAVENOUS | Status: DC

## 2020-10-05 MED ORDER — IRON SUCROSE 20 MG/ML IV SOLN
200.0000 mg | Freq: Once | INTRAVENOUS | Status: AC
  Administered 2020-10-05: 200 mg via INTRAVENOUS
  Filled 2020-10-05: qty 10

## 2020-10-05 MED ORDER — SODIUM CHLORIDE 0.9 % IV SOLN
Freq: Once | INTRAVENOUS | Status: AC
  Filled 2020-10-05: qty 250

## 2020-10-05 NOTE — Progress Notes (Signed)
Patient tolerated first Venofer treatment well. Patient monitored x 20 minutes post infusion. Patient and VSS. Discharged home.

## 2020-10-05 NOTE — Telephone Encounter (Signed)
LVM for patient to call our office. Had to change office visit to virtual visit. Dr.Anna will not be in the office today.

## 2020-10-05 NOTE — Telephone Encounter (Signed)
Reached out to pt to inform that b12 is in normal range at 271 and the range is 180-914 but on the low side of the range. we have added on a b12 inj. for his iron infusion on 9/12 and a second one 10/3 with his other iron treatment. However, pt did not answer call left a VM regarding details, included a callback number for any questions or conerns.

## 2020-10-05 NOTE — Progress Notes (Deleted)
Ian Mccormick  [address]  [place]  [place], [place] [address]  Main: [phone]  Fax: [phone]   Gastroenterology Consultation  Referring Provider:     Sindy Guadeloupe, MD Primary Care Physician:  Frazier Richards, MD Reason for Consultation:     iron deficiency anemia        HPI:   Virtual Visit via video  Note  I connected with patient on 10/05/20 at  1:15 PM EDT by video  and verified that I am speaking with the correct person using two identifiers.   I discussed the limitations, risks, security and privacy concerns of performing an evaluation and management service by video and the availability of in person appointments. I also discussed with the patient that there may be a patient responsible charge related to this service. The patient expressed understanding and agreed to proceed.  Location of the patient: Home Location of provider: Home Participating persons: Patient and provider only   History of Present Illness: C/c Iron deficiency anemia     Ian Mccormick is a 60 y.o. y/o male referred for consultation & management  by Dr. Janese Banks He has been referred for iron deficiency anemia.  Follow-up with the cancer center for the anemia.  Never had a colonoscopy.  History of MGUS as well ferritin of 7 MCV of 72 receiving IV iron B12 271 TSH normal hemoglobin 11 g 5 years back with 13 g  Past Medical History:  Diagnosis Date   Diabetes mellitus without complication ([place])    Hypertension     Past Surgical History:  Procedure Laterality Date   EYE SURGERY      Prior to Admission medications   Medication Sig Start Date End Date Taking? Authorizing Provider  aspirin EC 81 MG tablet Take 81 mg by mouth daily. Swallow whole.    [provider]  hydrochlorothiazide (HYDRODIURIL) 25 MG tablet Take 25 mg by mouth daily. 06/01/20   [provider]  insulin aspart protamine- aspart (NOVOLOG MIX 70/30) (70-30) 100 UNIT/ML injection Inject 40 Units into the  skin 2 (two) times daily with a meal.    [provider]  insulin detemir (LEVEMIR) 100 UNIT/ML injection Inject 50 Units into the skin 2 (two) times daily.    [provider]  JARDIANCE 25 MG TABS tablet Take 25 mg by mouth daily. 07/30/20   [provider]  losartan (COZAAR) 100 MG tablet Take 100 mg by mouth daily. 07/31/20   [provider]  metFORMIN (GLUCOPHAGE) 1000 MG tablet Take 1,000 mg by mouth 2 (two) times daily. 08/11/20   [provider]  metoprolol (TOPROL-XL) 200 MG 24 hr tablet Take 200 mg by mouth daily. 06/16/20   [provider]  rosuvastatin (CRESTOR) 20 MG tablet SMARTSIG:1 Tablet(s) By Mouth Every Evening 07/07/20   [provider]    Family History  Problem Relation Age of Onset   Hypertension Mother    Diabetes Mother      Social History   Tobacco Use   Smoking status: Former    Packs/day: 1.00    Years: 4.00    Pack years: 4.00    Types: Cigarettes    Quit date: 2000    Years since quitting: 22.6   Smokeless tobacco: Never  Vaping Use   Vaping Use: Former  Substance Use Topics   Alcohol use: No   Drug use: No    Allergies as of 10/05/2020   (No Known Allergies)  Review of Systems:    All systems reviewed and negative except where noted in HPI. General Appearance:    Alert, cooperative, no distress, appears stated age  Head:    Normocephalic, without obvious abnormality, atraumatic  Eyes:    PERRL, conjunctiva/corneas clear,  Ears:    Grossly normal hearing    Neurologic:   Grossly appears normal     Observations/Objective:  Labs: CBC    Component Value Date/Time   WBC 9.2 09/01/2020 1430   RBC 4.89 09/01/2020 1430   RBC 4.84 09/01/2020 1430   HGB 11.0 (L) 09/01/2020 1430   HGB 14.0 10/25/2011 2243   HCT 35.3 (L) 09/01/2020 1430   HCT 39.4 (L) 10/25/2011 2243   PLT 197 09/01/2020 1430   PLT 123 (L) 10/25/2011 2243   MCV 72.9 (L) 09/01/2020 1430   MCV 82 10/25/2011 2243    MCH 22.7 (L) 09/01/2020 1430   MCHC 31.2 09/01/2020 1430   RDW 15.4 09/01/2020 1430   RDW 13.1 10/25/2011 2243   LYMPHSABS 2.1 09/01/2020 1430   MONOABS 0.6 09/01/2020 1430   EOSABS 0.7 (H) 09/01/2020 1430   BASOSABS 0.1 09/01/2020 1430   CMP     Component Value Date/Time   NA 133 (L) 09/01/2020 1430   NA 137 10/25/2011 2243   K 3.9 09/01/2020 1430   K 4.3 05/21/2014 1154   CL 100 09/01/2020 1430   CL 102 10/25/2011 2243   CO2 24 09/01/2020 1430   CO2 24 10/25/2011 2243   GLUCOSE 112 (H) 09/01/2020 1430   GLUCOSE 195 (H) 10/25/2011 2243   BUN 30 (H) 09/01/2020 1430   BUN 20 (H) 10/25/2011 2243   CREATININE 1.42 (H) 09/01/2020 1430   CREATININE 0.82 10/25/2011 2243   CALCIUM 9.5 09/01/2020 1430   CALCIUM 9.2 10/25/2011 2243   PROT 7.5 09/01/2020 1430   PROT 7.5 10/25/2011 2243   ALBUMIN 4.1 09/01/2020 1430   ALBUMIN 4.0 10/25/2011 2243   AST 21 09/01/2020 1430   AST 29 10/25/2011 2243   ALT 18 09/01/2020 1430   ALT 39 10/25/2011 2243   ALKPHOS 72 09/01/2020 1430   ALKPHOS 67 10/25/2011 2243   BILITOT 0.3 09/01/2020 1430   BILITOT 0.4 10/25/2011 2243   GFRNONAA 56 (L) 09/01/2020 1430   GFRNONAA >60 10/25/2011 2243   GFRAA >60 02/12/2015 0820   GFRAA >60 10/25/2011 2243    Imaging Studies: No results found.  Assessment and Plan:   Ian Mccormick is a 60 y.o. y/o male has been referred for iron deficiency anemia also noted to have B12 deficiency.  No previous colonoscopy.  New symptoms of constipation and abdominal discomfort.    Plan :   Antiparietal cell and intrinsic factor antibody EGD and colonoscopy within 2 weeks, if negative will need capsule study of small bowel IV iron and B12 supplementation with Dr. Janese Banks Check celiac serology, urine analysis    I discussed the assessment and treatment plan with the patient. The patient was provided an opportunity to ask questions and all were answered. The patient agreed with the plan and demonstrated an  understanding of the instructions.   The patient was advised to call back or seek an in-person evaluation if the symptoms worsen or if the condition fails to improve as anticipated.  I provided *** minutes of face-to-face time during this encounter.   Dr Ian Bellows MD,MRCP [facility]) Gastroenterology/Hepatology Pager: [phone]   Speech recognition software was used to dictate the above note.

## 2020-10-14 ENCOUNTER — Other Ambulatory Visit: Payer: Self-pay

## 2020-10-14 ENCOUNTER — Other Ambulatory Visit: Payer: Self-pay | Admitting: Gastroenterology

## 2020-10-14 ENCOUNTER — Encounter: Payer: Self-pay | Admitting: Gastroenterology

## 2020-10-14 ENCOUNTER — Ambulatory Visit (INDEPENDENT_AMBULATORY_CARE_PROVIDER_SITE_OTHER): Payer: BC Managed Care – PPO | Admitting: Gastroenterology

## 2020-10-14 DIAGNOSIS — R1011 Right upper quadrant pain: Secondary | ICD-10-CM

## 2020-10-14 DIAGNOSIS — D509 Iron deficiency anemia, unspecified: Secondary | ICD-10-CM | POA: Diagnosis not present

## 2020-10-14 MED ORDER — PEG 3350-KCL-NA BICARB-NACL 420 G PO SOLR: ORAL | 0 refills | Status: DC

## 2020-10-14 NOTE — Progress Notes (Signed)
Ian Bellows MD, MRCP(U.K) [address]  [place]  [place], [place] [address]  Main: [phone]  Fax: [phone]   Gastroenterology Consultation  Referring Provider:     Sindy Guadeloupe, MD Primary Care Physician:  Ian Richards, MD Primary Gastroenterologist:  Dr. Jonathon Mccormick  Reason for Consultation:    Iron deficiency anemia         HPI:   Ian Mccormick is a 60 y.o. y/o male referred for consultation & management  by Dr Ian Mccormick .   The patient does not speak English here with translator.  He denies any overt blood loss.  No rectal bleeding nasal bleeds blood in the stool.  Recent onset constipation.  He has lost some weight after 2 episodes of COVID.  Has some right upper quadrant pain not really related to meals episodic 1-2 times a week.  Nonradiating.  Not relieved by bowel movement.  Not related to food intake.  Denies any NSAID use.  Decreased appetite. He follows hematology for iron deficiency anemia. Never had GI evaluation.  Low b12 at 271, Ferritin 7 , Hb 11, mcv 72 .    Past Medical History:  Diagnosis Date   Diabetes mellitus without complication ([place])    Hypertension     Past Surgical History:  Procedure Laterality Date   EYE SURGERY      Prior to Admission medications   Medication Sig Start Date End Date Taking? Authorizing Provider  aspirin EC 81 MG tablet Take 81 mg by mouth daily. Swallow whole.    [provider]  hydrochlorothiazide (HYDRODIURIL) 25 MG tablet Take 25 mg by mouth daily. 06/01/20   [provider]  insulin aspart protamine- aspart (NOVOLOG MIX 70/30) (70-30) 100 UNIT/ML injection Inject 40 Units into the skin 2 (two) times daily with a meal.    [provider]  insulin detemir (LEVEMIR) 100 UNIT/ML injection Inject 50 Units into the skin 2 (two) times daily.    [provider]  JARDIANCE 25 MG TABS tablet Take 25 mg by mouth daily. 07/30/20   [provider]  losartan (COZAAR) 100 MG tablet  Take 100 mg by mouth daily. 07/31/20   [provider]  metFORMIN (GLUCOPHAGE) 1000 MG tablet Take 1,000 mg by mouth 2 (two) times daily. 08/11/20   [provider]  metoprolol (TOPROL-XL) 200 MG 24 hr tablet Take 200 mg by mouth daily. 06/16/20   [provider]  rosuvastatin (CRESTOR) 20 MG tablet SMARTSIG:1 Tablet(s) By Mouth Every Evening 07/07/20   [provider]    Family History  Problem Relation Age of Onset   Hypertension Mother    Diabetes Mother      Social History   Tobacco Use   Smoking status: Former    Packs/day: 1.00    Years: 4.00    Pack years: 4.00    Types: Cigarettes    Quit date: 2000    Years since quitting: 22.6   Smokeless tobacco: Never  Vaping Use   Vaping Use: Former  Substance Use Topics   Alcohol use: No   Drug use: No    Allergies as of 10/14/2020   (No Known Allergies)    Review of Systems:    All systems reviewed and negative except where noted in HPI.   Physical Exam:  There were no vitals taken for this visit. No LMP for male patient. Psych:  Alert and cooperative. Normal mood and affect. General:   Alert,  Well-developed, well-nourished, pleasant and cooperative in NAD Head:  Normocephalic and atraumatic. Eyes:  Sclera clear, no icterus.   Conjunctiva pink. Ears:  Normal auditory acuity. Lungs:  Respirations even and unlabored.  Clear throughout to auscultation.   No wheezes, crackles, or rhonchi. No acute distress. Heart:  Regular rate and rhythm; no murmurs, clicks, rubs, or gallops. Abdomen:  Normal bowel sounds.  No bruits.  Soft, non-tender and non-distended without masses, hepatosplenomegaly or hernias noted.  No guarding or rebound tenderness.    Neurologic:  Alert and oriented x3;  grossly normal neurologically. Psych:  Alert and cooperative. Normal mood and affect.  Imaging Studies: No results found.  Assessment and Plan:   Ian Mccormick is a 60 y.o. y/o male has been referred for Iron  deficiency anemia.  No overt blood loss.  Some weight loss unintentional after 2 episodes of COVID-19.  New onset constipation.  Decreased appetite.   Plan  Check H pylori breath test , celiac serology , urine analysis B12 needs replacement  EGD+colonoscopy and if negative capsule study of the small bowel.  HIDA scan and right upper quadrant ultrasound  I have discussed alternative options, risks & benefits,  which include, but are not limited to, bleeding, infection, perforation,respiratory complication & drug reaction.  The patient agrees with this plan & written consent will be obtained.     Follow up in 8 weeks   Dr Ian Bellows MD,MRCP(U.K)

## 2020-10-14 NOTE — Patient Instructions (Signed)
GoL[employer]: Starting at 5:00 PM: Drink one 8 oz glass of mixture every 15 minutes until you finish half of the jug. Five hours prior to procedure, drink 8 oz glass of mixture every 15 minutes until it is all gone. Make sure you do not drink anything 4 hours prior to your procedure.

## 2020-10-15 ENCOUNTER — Telehealth: Payer: Self-pay

## 2020-10-15 NOTE — Telephone Encounter (Signed)
Called Mark the pharmacist and explained what he needed to put on patient's instruction.

## 2020-10-15 NOTE — Telephone Encounter (Signed)
Mark from the pharmacy is calling to see if he can get shorter instruction on the medication. He is requesting a call back to see if that is ok.

## 2020-10-18 LAB — INTRINSIC FACTOR ANTIBODIES: Intrinsic Factor Abs, Serum: 1 AU/mL (ref 0.0–1.1)

## 2020-10-18 LAB — ANTI-PARIETAL ANTIBODY: Parietal Cell Ab: 11.2 Units (ref 0.0–20.0)

## 2020-10-18 LAB — CELIAC DISEASE AB SCREEN W/RFX
Antigliadin Abs, IgA: 6 units (ref 0–19)
IgA/Immunoglobulin A, Serum: 709 mg/dL — ABNORMAL HIGH (ref 61–437)
Transglutaminase IgA: <2 U/mL (ref 0–3)

## 2020-10-19 ENCOUNTER — Inpatient Hospital Stay: Payer: BC Managed Care – PPO | Attending: Oncology

## 2020-10-19 ENCOUNTER — Other Ambulatory Visit: Payer: Self-pay | Admitting: Oncology

## 2020-10-19 VITALS — BP 159/79 | HR 61 | Temp 97.7°F | Resp 18

## 2020-10-19 DIAGNOSIS — D509 Iron deficiency anemia, unspecified: Secondary | ICD-10-CM | POA: Insufficient documentation

## 2020-10-19 MED ORDER — SODIUM CHLORIDE 0.9 % IV SOLN: 200.0000 mg | Freq: Once | INTRAVENOUS | Status: DC

## 2020-10-19 MED ORDER — IRON SUCROSE 20 MG/ML IV SOLN
200.0000 mg | Freq: Once | INTRAVENOUS | Status: AC
  Administered 2020-10-19: 200 mg via INTRAVENOUS
  Filled 2020-10-19: qty 10

## 2020-10-19 MED ORDER — SODIUM CHLORIDE 0.9 % IV SOLN
Freq: Once | INTRAVENOUS | Status: AC
  Filled 2020-10-19: qty 250

## 2020-10-19 MED ORDER — CYANOCOBALAMIN 1000 MCG/ML IJ SOLN
1000.0000 ug | INTRAMUSCULAR | Status: DC
  Administered 2020-10-19: 1000 ug via INTRAMUSCULAR
  Filled 2020-10-19: qty 1

## 2020-10-19 NOTE — Patient Instructions (Addendum)
[facility]   Discharge Instructions: Thank you for choosing [facility] to provide your oncology and hematology care.  If you have a lab appointment with the [facility], please go directly to the [facility] and check in at the registration area.  We strive to give you quality time with your provider. You may need to reschedule your appointment if you arrive late (15 or more minutes).  Arriving late affects you and other patients whose appointments are after yours.  Also, if you miss three or more appointments without notifying the office, you may be dismissed from the clinic at the provider's discretion.      For prescription refill requests, have your pharmacy contact our office and allow 72 hours for refills to be completed.    Today you received the following: Venofer infusion and Vitamin B-12 injection.   BELOW ARE SYMPTOMS THAT SHOULD BE REPORTED IMMEDIATELY: *FEVER GREATER THAN 100.4 F (38 C) OR HIGHER *CHILLS OR SWEATING *NAUSEA AND VOMITING THAT IS NOT CONTROLLED WITH YOUR NAUSEA MEDICATION *UNUSUAL SHORTNESS OF BREATH *UNUSUAL BRUISING OR BLEEDING *URINARY PROBLEMS (pain or burning when urinating, or frequent urination) *BOWEL PROBLEMS (unusual diarrhea, constipation, pain near the anus) TENDERNESS IN MOUTH AND THROAT WITH OR WITHOUT PRESENCE OF ULCERS (sore throat, sores in mouth, or a toothache) UNUSUAL RASH, SWELLING OR PAIN  UNUSUAL VAGINAL DISCHARGE OR ITCHING   Items with * indicate a potential emergency and should be followed up as soon as possible or go to the Emergency Department if any problems should occur.  Should you have questions after your visit or need to cancel or reschedule your appointment, please contact [facility]  [phone] and follow the prompts.  Office hours are 8:00 a.m. to 4:30 p.m. Monday - Friday. Please note that voicemails left after 4:00 p.m. may not  be returned until the following business day.  We are closed weekends and major holidays. You have access to a nurse at all times for urgent questions. Please call the main number to the clinic [phone] and follow the prompts.  For any non-urgent questions, you may also contact your provider using MyChart. We now offer e-Visits for anyone 2 and older to request care online for non-urgent symptoms. For details visit [email or web address].   Also download the MyChart app! Go to the app store, search "MyChart", open the app, select [facility], and log in with your MyChart username and password.  Due to Covid, a mask is required upon entering the hospital/clinic. If you do not have a mask, one will be given to you upon arrival. For doctor visits, patients may have 1 support person aged 80 or older with them. For treatment visits, patients cannot have anyone with them due to current Covid guidelines and our immunocompromised population.

## 2020-10-26 ENCOUNTER — Inpatient Hospital Stay: Payer: BC Managed Care – PPO

## 2020-10-26 VITALS — BP 120/71 | HR 69 | Temp 96.9°F | Resp 18

## 2020-10-26 DIAGNOSIS — D509 Iron deficiency anemia, unspecified: Secondary | ICD-10-CM

## 2020-10-26 DIAGNOSIS — R7989 Other specified abnormal findings of blood chemistry: Secondary | ICD-10-CM

## 2020-10-26 LAB — CBC WITH DIFFERENTIAL/PLATELET
Abs Immature Granulocytes: 0.06 10*3/uL (ref 0.00–0.07)
Basophils Absolute: 0.1 10*3/uL (ref 0.0–0.1)
Basophils Relative: 1 %
Eosinophils Absolute: 1.1 10*3/uL — ABNORMAL HIGH (ref 0.0–0.5)
Eosinophils Relative: 10 %
HCT: 39.8 % (ref 39.0–52.0)
Hemoglobin: 12.1 g/dL — ABNORMAL LOW (ref 13.0–17.0)
Immature Granulocytes: 1 %
Lymphocytes Relative: 26 %
Lymphs Abs: 2.7 10*3/uL (ref 0.7–4.0)
MCH: 22.8 pg — ABNORMAL LOW (ref 26.0–34.0)
MCHC: 30.4 g/dL (ref 30.0–36.0)
MCV: 75.1 fL — ABNORMAL LOW (ref 80.0–100.0)
Monocytes Absolute: 0.7 10*3/uL (ref 0.1–1.0)
Monocytes Relative: 7 %
Neutro Abs: 5.8 10*3/uL (ref 1.7–7.7)
Neutrophils Relative %: 55 %
Platelets: 214 10*3/uL (ref 150–400)
RBC: 5.3 MIL/uL (ref 4.22–5.81)
RDW: 18.6 % — ABNORMAL HIGH (ref 11.5–15.5)
WBC: 10.3 10*3/uL (ref 4.0–10.5)
nRBC: 0 % (ref 0.0–0.2)

## 2020-10-26 LAB — COMPREHENSIVE METABOLIC PANEL
ALT: 20 U/L (ref 0–44)
AST: 22 U/L (ref 15–41)
Albumin: 4.1 g/dL (ref 3.5–5.0)
Alkaline Phosphatase: 76 U/L (ref 38–126)
Anion gap: 11 (ref 5–15)
BUN: 33 mg/dL — ABNORMAL HIGH (ref 8–23)
CO2: 25 mmol/L (ref 22–32)
Calcium: 9.8 mg/dL (ref 8.9–10.3)
Chloride: 101 mmol/L (ref 98–111)
Creatinine, Ser: 1.44 mg/dL — ABNORMAL HIGH (ref 0.61–1.24)
GFR, Estimated: 55 mL/min — ABNORMAL LOW (ref >60)
Glucose, Bld: 47 mg/dL — ABNORMAL LOW (ref 70–99)
Potassium: 3.9 mmol/L (ref 3.5–5.1)
Sodium: 137 mmol/L (ref 135–145)
Total Bilirubin: 0.6 mg/dL (ref 0.3–1.2)
Total Protein: 7.7 g/dL (ref 6.5–8.1)

## 2020-10-26 MED ORDER — IRON SUCROSE 20 MG/ML IV SOLN
200.0000 mg | Freq: Once | INTRAVENOUS | Status: AC
  Administered 2020-10-26: 200 mg via INTRAVENOUS
  Filled 2020-10-26: qty 10

## 2020-10-26 MED ORDER — SODIUM CHLORIDE 0.9 % IV SOLN: 200.0000 mg | Freq: Once | INTRAVENOUS | Status: DC

## 2020-10-26 MED ORDER — SODIUM CHLORIDE 0.9 % IV SOLN
Freq: Once | INTRAVENOUS | Status: AC
  Filled 2020-10-26: qty 250

## 2020-10-26 NOTE — Patient Instructions (Signed)
Iron Sucrose Injection Qu es este medicamento? El Metzger HIERRO es un complejo de hierro. El hierro se [place] para la produccin de glbulos rojos sanos, los cuales transportan el oxgeno y los nutrientes hacia todo el cuerpo. Este medicamento se [place] para tratar la anemia a causa de deficiencia de hierro en personas con enfermedad renal crnica. Este medicamento puede ser utilizado para otros usos; si tiene alguna pregunta consulte con su proveedor de atencin mdica o con su farmacutico. Este medicamento puede ser utilizado para otros usos; si tiene alguna pregunta consulte con su proveedor de atencin mdica o con su farmacutico. MARCAS COMUNES: Venofer Qu le debo informar a mi profesional de la salud antes de tomar este medicamento? Necesita saber si usted presenta alguno de los [employer] o situaciones: anemia no provocada por niveles bajos de hierro enfermedad cardiaca niveles altos de hierro en la sangre enfermedad renal enfermedad heptica una reaccin alrgica o inusual al hierro, otros medicamentos, alimentos, colorantes o conservantes si est embarazada o buscando quedar embarazada si est amamantando a un beb Cmo debo Insurance account manager medicamento? Este medicamento se administra mediante infusin por va intravenosa. Lo administra un profesional de Technical sales engineer en un hospital o en un entorno clnico. Hable con su pediatra para informarse acerca del uso de este medicamento en nios. Aunque este medicamento se puede recetar a nios tan pequeos como de 2 aos de edad en casos selectos, existen precauciones que deben tomarse. Sobredosis: Pngase en contacto inmediatamente con un centro toxicolgico o una sala de urgencia si usted cree que haya tomado demasiado medicamento. ATENCIN: [employer] es solo para usted. No comparta este medicamento con nadie. Sobredosis: Pngase en contacto inmediatamente con un centro toxicolgico o una sala de urgencia si usted cree que  haya tomado demasiado medicamento. ATENCIN: [employer] es solo para usted. No comparta este medicamento con nadie. Qu sucede si me olvido de una dosis? Es importante no olvidar ninguna dosis. Informe a su mdico o a su profesional de la salud si no puede asistir a Photographer. Qu puede interactuar con este medicamento? No tome esta medicina con ninguno de los siguientes medicamentos: deferoxamina dimercaprol otros productos que Production manager hierro Esta medicina tambin puede Counselling psychologist con los siguientes medicamentos: cloranfenicol deferasirox Puede ser que esta lista no menciona todas las posibles interacciones. Informe a su profesional de [employer]	[place] de [employer] productos a base de hierbas, medicamentos de Pearsall o suplementos nutritivos que est tomando. Si usted fuma, consume bebidas alcohlicas o si utiliza drogas ilegales, indqueselo tambin a su profesional de [employer]	[place]. Algunas sustancias pueden interactuar con su medicamento. Puede ser que esta lista no menciona todas las posibles interacciones. Informe a su profesional de [employer]	[place] de [employer] productos a base de hierbas, medicamentos de [place] o suplementos nutritivos que est tomando. Si usted fuma, consume bebidas alcohlicas o si utiliza drogas ilegales, indqueselo tambin a su profesional de [employer]	[place]. Algunas sustancias pueden interactuar con su medicamento. A qu debo estar atento al usar [employer]? Visite a su mdico o a su profesional de la salud de [place] regular. Si los sntomas no comienzan a mejorar o si empeoran, consulte con su mdico o con su profesional de [employer]	[place]. Tal vez necesita realizarse anlisis de sangre mientras recibe Spade. Tal vez necesita seguir Counselling psychologist. Consulte a su mdico. Los alimentos que contienen hierro incluyen: alimentos integrales o con cereales, frutas secas, frijoles o arvejas, vegetales de hoja verde y carne que proviene de  rganos (hgado, rin). Qu  efectos secundarios puedo tener al [employer] este medicamento? Efectos secundarios que debe informar a su mdico o a Barrister's clerk de la salud tan pronto como sea posible: Chief of Staff como erupcin cutnea, picazn o urticarias, hinchazn de la cara, labios o lengua problemas respiratorios cambios en la presin sangunea tos pulso cardiaco rpido, irregular sensacin de desmayos o mareos, cadas fiebre o escalofros enrojecimiento, sudoracin o sensacin de calor molestias o dolores musculares o articulares convulsiones hinchazn de los tobillos o pies cansancio o debilidad inusual Efectos secundarios que, por lo general, no requieren atencin mdica (debe informarlos a su mdico o a su profesional de la salud si persisten o si son molestos): diarrea sensacin de molestias muculares dolor de cabeza irritacin en el lugar de la inyeccin nuseas, vmito malestar estomacal cansancio Puede ser que esta lista no menciona todos los posibles efectos secundarios. Comunquese a su mdico por asesoramiento mdico [employer] Usted puede informar los efectos secundarios a la FDA por telfono al 1-800-FDA-1088. Puede ser que esta lista no menciona todos los posibles efectos secundarios. Comunquese a su mdico por asesoramiento mdico [employer]. Usted puede informar los efectos secundarios a la FDA por telfono al 1-800-FDA-1088. Dnde debo guardar mi medicina? Este medicamento se administra en hospitales o clnicas y no necesitar guardarlo en su domicilio.ATENCIN:Este folleto es un resumen. Puede ser que no cubra toda la posible informacin. Si usted tiene preguntas acerca de esta medicina, consulte con su mdico, su farmacutico o su profesional de Technical sales engineer. ATENCIN: Este folleto es un resumen. Puede ser que no cubra toda la posible informacin. Si usted tiene preguntas acerca de esta medicina, consulte con su mdico, su farmacutico o su  profesional de Technical sales engineer.  2022 Elsevier/Gold Standard (2016-02-25 00:00:00)

## 2020-10-28 ENCOUNTER — Other Ambulatory Visit: Payer: BC Managed Care – PPO

## 2020-11-02 ENCOUNTER — Inpatient Hospital Stay: Payer: BC Managed Care – PPO

## 2020-11-02 VITALS — BP 135/71 | HR 59 | Temp 97.3°F | Resp 18

## 2020-11-02 DIAGNOSIS — D509 Iron deficiency anemia, unspecified: Secondary | ICD-10-CM

## 2020-11-02 MED ORDER — SODIUM CHLORIDE 0.9 % IV SOLN
Freq: Once | INTRAVENOUS | Status: AC
  Filled 2020-11-02: qty 250

## 2020-11-02 MED ORDER — IRON SUCROSE 20 MG/ML IV SOLN
200.0000 mg | Freq: Once | INTRAVENOUS | Status: AC
  Administered 2020-11-02: 200 mg via INTRAVENOUS
  Filled 2020-11-02: qty 10

## 2020-11-02 MED ORDER — SODIUM CHLORIDE 0.9 % IV SOLN: 200.0000 mg | Freq: Once | INTRAVENOUS | Status: DC

## 2020-11-02 NOTE — Patient Instructions (Signed)
[facility]  Discharge Instructions: Thank you for choosing [facility] to provide your oncology and hematology care.  If you have a lab appointment with the Cancer Center, please go directly to the Cancer Center and check in at the registration area.  Wear comfortable clothing and clothing appropriate for easy access to any Portacath or PICC line.   We strive to give you quality time with your provider. You may need to reschedule your appointment if you arrive late (15 or more minutes).  Arriving late affects you and other patients whose appointments are after yours.  Also, if you miss three or more appointments without notifying the office, you may be dismissed from the clinic at the provider's discretion.      For prescription refill requests, have your pharmacy contact our office and allow 72 hours for refills to be completed.    Today you received the following chemotherapy and/or immunotherapy agents VENOFER      To help prevent nausea and vomiting after your treatment, we encourage you to take your nausea medication as directed.  BELOW ARE SYMPTOMS THAT SHOULD BE REPORTED IMMEDIATELY: *FEVER GREATER THAN 100.4 F (38 C) OR HIGHER *CHILLS OR SWEATING *NAUSEA AND VOMITING THAT IS NOT CONTROLLED WITH YOUR NAUSEA MEDICATION *UNUSUAL SHORTNESS OF BREATH *UNUSUAL BRUISING OR BLEEDING *URINARY PROBLEMS (pain or burning when urinating, or frequent urination) *BOWEL PROBLEMS (unusual diarrhea, constipation, pain near the anus) TENDERNESS IN MOUTH AND THROAT WITH OR WITHOUT PRESENCE OF ULCERS (sore throat, sores in mouth, or a toothache) UNUSUAL RASH, SWELLING OR PAIN  UNUSUAL VAGINAL DISCHARGE OR ITCHING   Items with * indicate a potential emergency and should be followed up as soon as possible or go to the Emergency Department if any problems should occur.  Please show the CHEMOTHERAPY ALERT CARD or IMMUNOTHERAPY ALERT CARD at check-in to  the Emergency Department and triage nurse.  Should you have questions after your visit or need to cancel or reschedule your appointment, please contact [facility]  [phone] and follow the prompts.  Office hours are 8:00 a.m. to 4:30 p.m. Monday - Friday. Please note that voicemails left after 4:00 p.m. may not be returned until the following business day.  We are closed weekends and major holidays. You have access to a nurse at all times for urgent questions. Please call the main number to the clinic [phone] and follow the prompts.  For any non-urgent questions, you may also contact your provider using MyChart. We now offer e-Visits for anyone 18 and older to request care online for non-urgent symptoms. For details visit [email or web address].   Also download the MyChart app! Go to the app store, search "MyChart", open the app, select [facility], and log in with your MyChart username and password.  Due to Covid, a mask is required upon entering the hospital/clinic. If you do not have a mask, one will be given to you upon arrival. For doctor visits, patients may have 1 support person aged 18 or older with them. For treatment visits, patients cannot have anyone with them due to current Covid guidelines and our immunocompromised population.   Iron Sucrose Injection What is this medication? IRON SUCROSE (EYE ern SOO krose) treats low levels of iron (iron deficiency anemia) in people with kidney disease. Iron is a mineral that plays an important role in making red blood cells, which carry oxygen from your lungs to the rest of your body. This medicine may   be used for other purposes; ask your health care provider or pharmacist if you have questions. COMMON BRAND NAME(S): Venofer What should I tell my care team before I take this medication? They need to know if you have any of these conditions: Anemia not caused by low iron levels Heart disease High levels  of iron in the blood Kidney disease Liver disease An unusual or allergic reaction to iron, other medications, foods, dyes, or preservatives Pregnant or trying to get pregnant Breast-feeding How should I use this medication? This medication is for infusion into a vein. It is given in a hospital or clinic setting. Talk to your care team about the use of this medication in children. While this medication may be prescribed for children as young as 2 years for selected conditions, precautions do apply. Overdosage: If you think you have taken too much of this medicine contact a poison control center or emergency room at once. NOTE: This medicine is only for you. Do not share this medicine with others. What if I miss a dose? It is important not to miss your dose. Call your care team if you are unable to keep an appointment. What may interact with this medication? Do not take this medication with any of the following: Deferoxamine Dimercaprol Other iron products This medication may also interact with the following: Chloramphenicol Deferasirox This list may not describe all possible interactions. Give your health care provider a list of all the medicines, herbs, non-prescription drugs, or dietary supplements you use. Also tell them if you smoke, drink alcohol, or use illegal drugs. Some items may interact with your medicine. What should I watch for while using this medication? Visit your care team regularly. Tell your care team if your symptoms do not start to get better or if they get worse. You may need blood work done while you are taking this medication. You may need to follow a special diet. Talk to your care team. Foods that contain iron include: whole grains/cereals, dried fruits, beans, or peas, leafy green vegetables, and organ meats (liver, kidney). What side effects may I notice from receiving this medication? Side effects that you should report to your care team as soon as  possible: Allergic reactions-skin rash, itching, hives, swelling of the face, lips, tongue, or throat Low blood pressure-dizziness, feeling faint or lightheaded, blurry vision Shortness of breath Side effects that usually do not require medical attention (report to your care team if they continue or are bothersome): Flushing Headache Joint pain Muscle pain Nausea Pain, redness, or irritation at injection site This list may not describe all possible side effects. Call your doctor for medical advice about side effects. You may report side effects to FDA at 1-800-FDA-1088. Where should I keep my medication? This medication is given in a hospital or clinic and will not be stored at home. NOTE: This sheet is a summary. It may not cover all possible information. If you have questions about this medicine, talk to your doctor, pharmacist, or health care provider.  2022 Elsevier/Gold Standard (2020-04-21 12:52:06)  

## 2020-11-03 ENCOUNTER — Other Ambulatory Visit: Payer: Self-pay

## 2020-11-03 ENCOUNTER — Encounter
Admission: RE | Admit: 2020-11-03 | Discharge: 2020-11-03 | Disposition: A | Discharge status: Home / Self Care | Payer: BC Managed Care – PPO | Source: Ambulatory Visit | Attending: Gastroenterology | Admitting: Gastroenterology

## 2020-11-03 DIAGNOSIS — R1011 Right upper quadrant pain: Secondary | ICD-10-CM | POA: Diagnosis present

## 2020-11-03 MED ORDER — TECHNETIUM TC 99M MEBROFENIN IV KIT
5.0000 | PACK | Freq: Once | INTRAVENOUS | Status: AC | PRN
  Administered 2020-11-03: 5.35 via INTRAVENOUS

## 2020-11-06 ENCOUNTER — Encounter: Payer: Self-pay | Admitting: Gastroenterology

## 2020-11-06 ENCOUNTER — Telehealth: Payer: Self-pay

## 2020-11-06 NOTE — Telephone Encounter (Signed)
Called patient and left him a detailed message letting him know the below information. I also told him to call me back if he wanted me to refer him to general surgery.

## 2020-11-06 NOTE — Telephone Encounter (Signed)
-----   Message from Jonathon Bellows, MD sent at 11/05/2020 12:07 PM EDT ----- Inform patient that his gall bladder is not squeezing well and that can be causing the abdominal pain that he had when he came to see me. If the pain persists suggest referral to Dr Hampton Abbot to determine if his gall bladder needs to come out.    Dr Jonathon Bellows MD,MRCP [facility]) Gastroenterology/Hepatology Pager: [phone]

## 2020-11-09 ENCOUNTER — Ambulatory Visit: Payer: BC Managed Care – PPO | Admitting: Anesthesiology

## 2020-11-09 ENCOUNTER — Encounter
Admission: RE | Disposition: A | Discharge status: Home / Self Care | Payer: Self-pay | Source: Home / Self Care | Attending: Gastroenterology

## 2020-11-09 ENCOUNTER — Inpatient Hospital Stay: Payer: BC Managed Care – PPO

## 2020-11-09 ENCOUNTER — Encounter: Payer: Self-pay | Admitting: Gastroenterology

## 2020-11-09 ENCOUNTER — Ambulatory Visit
Admission: RE | Admit: 2020-11-09 | Discharge: 2020-11-09 | Disposition: A | Discharge status: Home / Self Care | Payer: BC Managed Care – PPO | Attending: Gastroenterology | Admitting: Gastroenterology

## 2020-11-09 DIAGNOSIS — D122 Benign neoplasm of ascending colon: Secondary | ICD-10-CM

## 2020-11-09 DIAGNOSIS — Z794 Long term (current) use of insulin: Secondary | ICD-10-CM | POA: Diagnosis not present

## 2020-11-09 DIAGNOSIS — K2950 Unspecified chronic gastritis without bleeding: Secondary | ICD-10-CM | POA: Insufficient documentation

## 2020-11-09 DIAGNOSIS — B9681 Helicobacter pylori [H. pylori] as the cause of diseases classified elsewhere: Secondary | ICD-10-CM | POA: Diagnosis not present

## 2020-11-09 DIAGNOSIS — Z87891 Personal history of nicotine dependence: Secondary | ICD-10-CM | POA: Insufficient documentation

## 2020-11-09 DIAGNOSIS — K31A19 Gastric intestinal metaplasia without dysplasia, unspecified site: Secondary | ICD-10-CM | POA: Insufficient documentation

## 2020-11-09 DIAGNOSIS — Z7984 Long term (current) use of oral hypoglycemic drugs: Secondary | ICD-10-CM | POA: Diagnosis not present

## 2020-11-09 DIAGNOSIS — D509 Iron deficiency anemia, unspecified: Secondary | ICD-10-CM

## 2020-11-09 DIAGNOSIS — Z79899 Other long term (current) drug therapy: Secondary | ICD-10-CM | POA: Diagnosis not present

## 2020-11-09 DIAGNOSIS — R1011 Right upper quadrant pain: Secondary | ICD-10-CM

## 2020-11-09 DIAGNOSIS — D120 Benign neoplasm of cecum: Secondary | ICD-10-CM | POA: Insufficient documentation

## 2020-11-09 HISTORY — PX: COLONOSCOPY WITH PROPOFOL: SHX5780

## 2020-11-09 HISTORY — PX: ESOPHAGOGASTRODUODENOSCOPY (EGD) WITH PROPOFOL: SHX5813

## 2020-11-09 LAB — GLUCOSE, CAPILLARY: Glucose-Capillary: 162 mg/dL — ABNORMAL HIGH (ref 70–99)

## 2020-11-09 SURGERY — COLONOSCOPY WITH PROPOFOL
Anesthesia: General

## 2020-11-09 MED ORDER — ONDANSETRON HCL 4 MG/2ML IJ SOLN
INTRAMUSCULAR | Status: DC | PRN
  Administered 2020-11-09: 4 mg via INTRAVENOUS

## 2020-11-09 MED ORDER — EPHEDRINE SULFATE 50 MG/ML IJ SOLN
INTRAMUSCULAR | Status: DC | PRN
  Administered 2020-11-09: 5 mg via INTRAVENOUS

## 2020-11-09 MED ORDER — PROPOFOL 10 MG/ML IV BOLUS
INTRAVENOUS | Status: DC | PRN
  Administered 2020-11-09 (×3): 50 mg via INTRAVENOUS

## 2020-11-09 MED ORDER — LIDOCAINE HCL (CARDIAC) PF 100 MG/5ML IV SOSY
PREFILLED_SYRINGE | INTRAVENOUS | Status: DC | PRN
  Administered 2020-11-09: 100 mg via INTRAVENOUS

## 2020-11-09 MED ORDER — EPHEDRINE 5 MG/ML INJ
INTRAVENOUS | Status: AC
  Filled 2020-11-09: qty 5

## 2020-11-09 MED ORDER — LIDOCAINE HCL (PF) 2 % IJ SOLN
INTRAMUSCULAR | Status: AC
  Filled 2020-11-09: qty 5

## 2020-11-09 MED ORDER — SODIUM CHLORIDE 0.9 % IV SOLN: INTRAVENOUS | Status: DC

## 2020-11-09 MED ORDER — PROPOFOL 500 MG/50ML IV EMUL
INTRAVENOUS | Status: DC | PRN
  Administered 2020-11-09: 150 ug/kg/min via INTRAVENOUS

## 2020-11-09 NOTE — Transfer of Care (Signed)
Immediate Anesthesia Transfer of Care Note  Patient: Ian Mccormick  Procedure(s) Performed: COLONOSCOPY WITH PROPOFOL ESOPHAGOGASTRODUODENOSCOPY (EGD) WITH PROPOFOL  Patient Location: PACU and Endoscopy Unit  Anesthesia Type:General  Level of Consciousness: drowsy  Airway & Oxygen Therapy: Patient Spontanous Breathing  Post-op Assessment: Report given to RN  Post vital signs: stable  Last Vitals:  Vitals Value Taken Time  BP 110/62 11/09/20 1048  Temp    Pulse 66 11/09/20 1050  Resp 16 11/09/20 1050  SpO2 98 % 11/09/20 1050  Vitals shown include unvalidated device data.  Last Pain:  Vitals:   11/09/20 1048  TempSrc:   PainSc: 0-No pain         Complications: No notable events documented.

## 2020-11-09 NOTE — Anesthesia Preprocedure Evaluation (Signed)
Anesthesia Evaluation  Patient identified by MRN, date of birth, ID band Patient awake    Reviewed: Allergy & Precautions, NPO status , Patient's Chart, lab work & pertinent test results  History of Anesthesia Complications Negative for: history of anesthetic complications  Airway Mallampati: II  TM Distance: >3 FB Neck ROM: Full    Dental  (+) Edentulous Upper, Edentulous Lower   Pulmonary neg pulmonary ROS, neg sleep apnea, neg COPD, Patient abstained from smoking.Not current smoker, former smoker,    Pulmonary exam normal breath sounds clear to auscultation       Cardiovascular Exercise Tolerance: Good METShypertension, (-) CAD and (-) Past MI (-) dysrhythmias  Rhythm:Regular Rate:Normal - Systolic murmurs    Neuro/Psych negative neurological ROS  negative psych ROS   GI/Hepatic neg GERD  ,(+)     (-) substance abuse  ,   Endo/Other  diabetes, Insulin Dependent  Renal/GU negative Renal ROS     Musculoskeletal   Abdominal   Peds  Hematology  (+) anemia ,   Anesthesia Other Findings Past Medical History: No date: Diabetes mellitus without complication (HCC) No date: Hypertension  Reproductive/Obstetrics                             Anesthesia Physical Anesthesia Plan  ASA: 2  Anesthesia Plan: General   Post-op Pain Management:    Induction: Intravenous  PONV Risk Score and Plan: 2 and Ondansetron, Propofol infusion and TIVA  Airway Management Planned: Nasal Cannula  Additional Equipment: None  Intra-op Plan:   Post-operative Plan:   Informed Consent: I have reviewed the patients History and Physical, chart, labs and discussed the procedure including the risks, benefits and alternatives for the proposed anesthesia with the patient or authorized representative who has indicated his/her understanding and acceptance.     Dental advisory given and Interpreter used for  interveiw (spanish video interpreter used)  Plan Discussed with: CRNA and Surgeon  Anesthesia Plan Comments: (Discussed risks of anesthesia with patient, including possibility of difficulty with spontaneous ventilation under anesthesia necessitating airway intervention, PONV, and rare risks such as cardiac or respiratory or neurological events, and allergic reactions. Patient understands.)        Anesthesia Quick Evaluation

## 2020-11-09 NOTE — Anesthesia Postprocedure Evaluation (Signed)
Anesthesia Post Note  Patient: Ian Mccormick  Procedure(s) Performed: COLONOSCOPY WITH PROPOFOL ESOPHAGOGASTRODUODENOSCOPY (EGD) WITH PROPOFOL  Patient location during evaluation: PACU Anesthesia Type: General Level of consciousness: awake and alert, oriented and patient cooperative Pain management: pain level controlled Vital Signs Assessment: post-procedure vital signs reviewed and stable Respiratory status: spontaneous breathing, nonlabored ventilation and respiratory function stable Cardiovascular status: blood pressure returned to baseline and stable Postop Assessment: adequate PO intake Anesthetic complications: no   No notable events documented.   Last Vitals:  Vitals:   11/09/20 1108 11/09/20 1118  BP: (!) 152/76   Pulse: (!) 57 (!) 58  Resp: 16 16  Temp:    SpO2: 100% 100%    Last Pain:  Vitals:   11/09/20 1108  TempSrc:   PainSc: 0-No pain                 Darrin Nipper

## 2020-11-09 NOTE — Op Note (Signed)
[facility] Gastroenterology Patient Name: Ian Mccormick Procedure Date: 11/09/2020 9:59 AM MRN: [id number] Account #: [id number] Date of Birth: 09/22/1960 Admit Type: Outpatient Age: 60 Room: [facility] ENDO ROOM 4 Gender: Male Note Status: Finalized Instrument Name: Jasper Riling [id number] Procedure:             Colonoscopy Indications:           Iron deficiency anemia Providers:             Jonathon Bellows MD, MD Referring MD:          Hattie Perch. Adamo (Referring MD) Medicines:             Propofol per Anesthesia, Monitored Anesthesia Care Complications:         No immediate complications. Procedure:             Pre-Anesthesia Assessment:                        - Prior to the procedure, a History and Physical was                         performed, and patient medications, allergies and                         sensitivities were reviewed. The patient's tolerance                         of previous anesthesia was reviewed.                        - The risks and benefits of the procedure and the                         sedation options and risks were discussed with the                         patient. All questions were answered and informed                         consent was obtained.                        - ASA Grade Assessment: II - A patient with mild                         systemic disease.                        After obtaining informed consent, the colonoscope was                         passed under direct vision. Throughout the procedure,                         the patient's blood pressure, pulse, and oxygen                         saturations were monitored continuously. The                         Colonoscope was  introduced through the anus and                         advanced to the the cecum, identified by the                         appendiceal orifice. The colonoscopy was performed                         with ease. The patient tolerated the procedure well.                          The quality of the bowel preparation was excellent. Findings:      The perianal and digital rectal examinations were normal.      Five sessile polyps were found in the ascending colon and cecum. The       polyps were 4 to 6 mm in size. These polyps were removed with a cold       snare. Resection and retrieval were complete. To prevent bleeding after       the polypectomy, one hemostatic clip was successfully placed. There was       no bleeding during, or at the end, of the procedure.      The exam was otherwise without abnormality on direct and retroflexion       views. Impression:            - Five 4 to 6 mm polyps in the ascending colon and in                         the cecum, removed with a cold snare. Resected and                         retrieved.                        - The examination was otherwise normal on direct and                         retroflexion views. Recommendation:        - Discharge patient to home (with escort).                        - Resume previous diet.                        - Continue present medications.                        - Await pathology results.                        - Repeat colonoscopy for surveillance based on                         pathology results.                        - To visualize the small bowel, perform video capsule  endoscopy in 2 weeks. Procedure Code(s):     --- Professional ---                        [phone], Colonoscopy, flexible; with removal of                         tumor(s), polyp(s), or other lesion(s) by snare                         technique Diagnosis Code(s):     --- Professional ---                        K63.5, Polyp of colon                        D50.9, Iron deficiency anemia, unspecified CPT copyright 2019 American Medical Association. All rights reserved. The codes documented in this report are preliminary and upon coder review may  be revised to meet current compliance  requirements. Jonathon Bellows, MD Jonathon Bellows MD, MD 11/09/2020 10:44:08 AM This report has been signed electronically. Number of Addenda: 0 Note Initiated On: 11/09/2020 9:59 AM Scope Withdrawal Time: 0 hours 21 minutes 47 seconds  Total Procedure Duration: 0 hours 24 minutes 35 seconds  Estimated Blood Loss:  Estimated blood loss: none.      [facility]

## 2020-11-09 NOTE — H&P (Signed)
Jonathon Bellows, MD [address], [place], [place], [place], [address] [address], [place], [place], [place], [address] Phone: [phone]  Fax: [phone]  Primary Care Physician:  Frazier Richards, MD   Pre-Procedure History & Physical: HPI:  Ian Mccormick is a 60 y.o. male is here for an endoscopy and colonoscopy    Past Medical History:  Diagnosis Date   Diabetes mellitus without complication ([place])    Hypertension     Past Surgical History:  Procedure Laterality Date   EYE SURGERY      Prior to Admission medications   Medication Sig Start Date End Date Taking? Authorizing Provider  amLODipine (NORVASC) 10 MG tablet Take 1 tablet by mouth daily. 07/29/19  Yes [provider]  hydrochlorothiazide (HYDRODIURIL) 25 MG tablet Take 25 mg by mouth daily. 06/01/20  Yes [provider]  insulin detemir (LEVEMIR) 100 UNIT/ML injection Inject 50 Units into the skin 2 (two) times daily.   Yes [provider]  metFORMIN (GLUCOPHAGE) 1000 MG tablet Take 1,000 mg by mouth 2 (two) times daily. 08/11/20  Yes [provider]  metoprolol (TOPROL-XL) 200 MG 24 hr tablet Take 200 mg by mouth daily. 06/16/20  Yes [provider]  aspirin EC 81 MG tablet Take 81 mg by mouth daily. Swallow whole.    [provider]  insulin aspart protamine- aspart (NOVOLOG MIX 70/30) (70-30) 100 UNIT/ML injection Inject 40 Units into the skin 2 (two) times daily with a meal.    [provider]  JARDIANCE 25 MG TABS tablet Take 25 mg by mouth daily. 07/30/20   [provider]  losartan (COZAAR) 100 MG tablet Take 100 mg by mouth daily. 07/31/20   [provider]  polyethylene glycol-electrolytes (NULYTELY) 420 g solution Prepare according to package instructions. Starting at 5:00 PM: Drink one 8 oz glass of mixture every 15 minutes until you finish half of the jug. Five hours prior to procedure, drink 8 oz glass of mixture every 15 minutes  until it is all gone. Make sure you do not drink anything 4 hours prior to your procedure. 10/14/20   Jonathon Bellows, MD  rosuvastatin (CRESTOR) 20 MG tablet SMARTSIG:1 Tablet(s) By Mouth Every Evening 07/07/20   [provider]    Allergies as of 10/15/2020   (No Known Allergies)    Family History  Problem Relation Age of Onset   Hypertension Mother    Diabetes Mother     Social History   Socioeconomic History   Marital status: Married    Spouse name: Not on file   Number of children: Not on file   Years of education: Not on file   Highest education level: Not on file  Occupational History   Not on file  Tobacco Use   Smoking status: Former    Packs/day: 1.00    Years: 4.00    Pack years: 4.00    Types: Cigarettes    Quit date: 2000    Years since quitting: 22.7   Smokeless tobacco: Never  Vaping Use   Vaping Use: Former  Substance and Sexual Activity   Alcohol use: No   Drug use: No   Sexual activity: Not on file  Other Topics Concern   Not on file  Social History Narrative   Not on file   Social Determinants of Health   Financial Resource Strain: Not on file  Food Insecurity: Not on file  Transportation Needs: Not on file  Physical Activity: Not on file  Stress: Not on file  Social Connections: Not on file  Intimate Partner Violence: Not on file    Review of Systems: See HPI, otherwise negative ROS  Physical Exam: BP (!) 160/84   Pulse (!) 55   Temp (!) 96.3 F (35.7 C) (Temporal)   Resp 18   Ht 5\' 5"  (1.651 m)   Wt 80.3 kg   SpO2 100%   BMI 29.45 kg/m  General:   Alert,  pleasant and cooperative in NAD Head:  Normocephalic and atraumatic. Neck:  Supple; no masses or thyromegaly. Lungs:  Clear throughout to auscultation, normal respiratory effort.    Heart:  +S1, +S2, Regular rate and rhythm, No edema. Abdomen:  Soft, nontender and nondistended. Normal bowel sounds, without guarding, and without rebound.   Neurologic:  Alert and   oriented x4;  grossly normal neurologically.  Impression/Plan: Ian Mccormick is here for an endoscopy and colonoscopy  to be performed for  evaluation of iron deficiency anemia. Ipad translator used via video for communication    Risks, benefits, limitations, and alternatives regarding endoscopy have been reviewed with the patient.  Questions have been answered.  All parties agreeable.   Jonathon Bellows, MD  11/09/2020, 9:28 AM

## 2020-11-09 NOTE — Op Note (Signed)
[facility] Gastroenterology Patient Name: Ian Mccormick Procedure Date: 11/09/2020 9:59 AM MRN: [id number] Account #: [id number] Date of Birth: 27-Feb-1960 Admit Type: Outpatient Age: 60 Room: [facility] ENDO ROOM 4 Gender: Male Note Status: Finalized Instrument Name: Michaelle Birks [id number] Procedure:             Upper GI endoscopy Indications:           Iron deficiency anemia Providers:             Jonathon Bellows MD, MD Referring MD:          Hattie Perch. Sherril Cong (Referring MD) Medicines:             Monitored Anesthesia Care Complications:         No immediate complications. Procedure:             Pre-Anesthesia Assessment:                        - Prior to the procedure, a History and Physical was                         performed, and patient medications, allergies and                         sensitivities were reviewed. The patient's tolerance                         of previous anesthesia was reviewed.                        - The risks and benefits of the procedure and the                         sedation options and risks were discussed with the                         patient. All questions were answered and informed                         consent was obtained.                        - ASA Grade Assessment: II - A patient with mild                         systemic disease.                        After obtaining informed consent, the endoscope was                         passed under direct vision. Throughout the procedure,                         the patient's blood pressure, pulse, and oxygen                         saturations were monitored continuously. The Endoscope                         was introduced  through the mouth, and advanced to the                         third part of duodenum. The upper GI endoscopy was                         accomplished with ease. The patient tolerated the                         procedure well. Findings:      The examined duodenum  was normal.      The esophagus was normal.      The entire examined stomach was normal. Biopsies were taken with a cold       forceps for histology.      The cardia and gastric fundus were normal on retroflexion. Impression:            - Normal examined duodenum.                        - Normal esophagus.                        - Normal stomach. Biopsied. Recommendation:        - Await pathology results.                        - Perform a colonoscopy today. Procedure Code(s):     --- Professional ---                        [phone], Esophagogastroduodenoscopy, flexible,                         transoral; with biopsy, single or multiple Diagnosis Code(s):     --- Professional ---                        D50.9, Iron deficiency anemia, unspecified CPT copyright 2019 American Medical Association. All rights reserved. The codes documented in this report are preliminary and upon coder review may  be revised to meet current compliance requirements. Jonathon Bellows, MD Jonathon Bellows MD, MD 11/09/2020 10:15:24 AM This report has been signed electronically. Number of Addenda: 0 Note Initiated On: 11/09/2020 9:59 AM Estimated Blood Loss:  Estimated blood loss: none.      [facility]

## 2020-11-10 ENCOUNTER — Encounter: Payer: Self-pay | Admitting: Gastroenterology

## 2020-11-11 ENCOUNTER — Telehealth: Payer: Self-pay

## 2020-11-11 LAB — SURGICAL PATHOLOGY

## 2020-11-11 MED ORDER — OMEPRAZOLE 20 MG PO CPDR: 20.0000 mg | DELAYED_RELEASE_CAPSULE | Freq: Two times a day (BID) | ORAL | 0 refills | Status: DC

## 2020-11-11 MED ORDER — AMOXICILLIN 500 MG PO TABS: 500.0000 mg | ORAL_TABLET | Freq: Two times a day (BID) | ORAL | 0 refills | Status: AC

## 2020-11-11 MED ORDER — CLARITHROMYCIN 500 MG PO TABS: 500.0000 mg | ORAL_TABLET | Freq: Two times a day (BID) | ORAL | 0 refills | Status: AC

## 2020-11-11 NOTE — Telephone Encounter (Signed)
-----   Message from Jonathon Bellows, MD sent at 11/11/2020 10:49 AM EDT ----- Inform  H pylori positive on gastric biopsies.   Suggest clarithromycin 500 mg PO BID, amoxicillin 1 gram BID, omeprazole 20 mg BID all for 14 days.  , will need repeat H pylori stool antigen to check for eradication after .  In addition changes of intestinal metaplasia was seen and hence would require an EGD in 6 months with gastric mapping  Polyps were precancerous repeat colonoscopy in 3 years

## 2020-11-11 NOTE — Telephone Encounter (Signed)
Patient was contacted and informed him what was needed to be done due to having H Pylori. Patient agreed and he also stated that he would want to wait to be referred to the general surgeon once he finished his antibiotics to see if that will help with his abdominal pain. However, he also stated that since he was told that his gallbladder was not working properly, then he would let us know if we go ahead and refer him to general surgery as advised by Dr. Vicente Males or not. Antibiotics will be sent to his pharmacy. Patient was also informed to come to our office or a [facility] location 6 weeks after he was done with his antibiotic to check for a H Pylori stool and he agreed.

## 2020-11-12 ENCOUNTER — Inpatient Hospital Stay: Payer: BC Managed Care – PPO

## 2020-11-16 ENCOUNTER — Other Ambulatory Visit: Payer: Self-pay

## 2020-11-16 ENCOUNTER — Inpatient Hospital Stay: Payer: BC Managed Care – PPO | Attending: Oncology

## 2020-11-16 ENCOUNTER — Ambulatory Visit: Payer: BC Managed Care – PPO | Admitting: Gastroenterology

## 2020-11-16 VITALS — BP 130/67 | HR 60 | Temp 97.4°F | Resp 16

## 2020-11-16 DIAGNOSIS — D472 Monoclonal gammopathy: Secondary | ICD-10-CM | POA: Diagnosis present

## 2020-11-16 DIAGNOSIS — D509 Iron deficiency anemia, unspecified: Secondary | ICD-10-CM | POA: Diagnosis not present

## 2020-11-16 MED ORDER — SODIUM CHLORIDE 0.9 % IV SOLN
Freq: Once | INTRAVENOUS | Status: AC
  Filled 2020-11-16: qty 250

## 2020-11-16 MED ORDER — CYANOCOBALAMIN 1000 MCG/ML IJ SOLN
1000.0000 ug | INTRAMUSCULAR | Status: DC
  Administered 2020-11-16: 1000 ug via INTRAMUSCULAR
  Filled 2020-11-16: qty 1

## 2020-11-16 MED ORDER — SODIUM CHLORIDE 0.9 % IV SOLN: 200.0000 mg | Freq: Once | INTRAVENOUS | Status: DC

## 2020-11-16 MED ORDER — IRON SUCROSE 20 MG/ML IV SOLN
200.0000 mg | Freq: Once | INTRAVENOUS | Status: AC
  Administered 2020-11-16: 200 mg via INTRAVENOUS
  Filled 2020-11-16: qty 10

## 2020-11-16 NOTE — Patient Instructions (Signed)

## 2020-12-15 ENCOUNTER — Other Ambulatory Visit: Payer: Self-pay

## 2020-12-15 ENCOUNTER — Ambulatory Visit (INDEPENDENT_AMBULATORY_CARE_PROVIDER_SITE_OTHER): Payer: BC Managed Care – PPO | Admitting: Gastroenterology

## 2020-12-15 VITALS — BP 154/89 | HR 60 | Temp 99.0°F | Wt 177.6 lb

## 2020-12-15 DIAGNOSIS — R1011 Right upper quadrant pain: Secondary | ICD-10-CM

## 2020-12-15 DIAGNOSIS — D509 Iron deficiency anemia, unspecified: Secondary | ICD-10-CM

## 2020-12-15 DIAGNOSIS — Z8619 Personal history of other infectious and parasitic diseases: Secondary | ICD-10-CM

## 2020-12-15 DIAGNOSIS — K828 Other specified diseases of gallbladder: Secondary | ICD-10-CM | POA: Diagnosis not present

## 2020-12-15 DIAGNOSIS — K31A11 Gastric intestinal metaplasia without dysplasia, involving the antrum: Secondary | ICD-10-CM

## 2020-12-15 DIAGNOSIS — K219 Gastro-esophageal reflux disease without esophagitis: Secondary | ICD-10-CM

## 2020-12-15 MED ORDER — OMEPRAZOLE 40 MG PO CPDR: 40.0000 mg | DELAYED_RELEASE_CAPSULE | Freq: Every day | ORAL | 0 refills | Status: AC

## 2020-12-15 NOTE — Addendum Note (Signed)
Addended by: Lurlean Nanny on: 12/15/2020 04:03 PM   Modules accepted: Orders, SmartSet

## 2020-12-15 NOTE — Progress Notes (Signed)
Jonathon Bellows MD, MRCP(U.K) [address]  [place]  [place], [place] [address]  Main: [phone]  Fax: [phone]   Primary Care Physician: Frazier Richards, MD  Primary Gastroenterologist:  Dr. Jonathon Bellows   Chief complaint: Follow-up of iron deficiency anemia and abdominal pain  HPI: MATILDE MARKIE is a 60 y.o. male   Summary of history :  He was initially referred and seen on 10/14/2020 for right upper quadrant pain going on 1 to 2 times per week.  History of recent COVID.  On IV iron for iron deficiency managed by hematology.  Also had B12 deficiency at the same time.  No overt blood loss.   Interval history   10/14/2020-12/15/2020  11/09/2020: EGD: Normal, colonoscopy: 5 polyps resected in the ascending colon and cecum 4 to 6 mm in size.  Biopsies of the stomach showed active gastritis with H. pylori and intestinal metaplasia.  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  Commenced on triple therapy for H. pylori with clarithromycin based therapy.  10/14/2020 celiac serology, antiparietal cell antibody, intrinsic factor antibody negative. 11/03/2020 HIDA scan showed decreased ejection fraction of 30%.  Referred to Dr. Hampton Abbot for evaluation for cholecystectomy   Doing well after his procedures.  Continues to have right upper quadrant discomfort.  Not particularly related to meals.  Started developing heartburn after treatment for H. pylori  Current Outpatient Medications  Medication Sig Dispense Refill   amLODipine (NORVASC) 10 MG tablet Take 1 tablet by mouth daily.     aspirin EC 81 MG tablet Take 81 mg by mouth daily. Swallow whole.     hydrochlorothiazide (HYDRODIURIL) 25 MG tablet Take 25 mg by mouth daily.     insulin aspart protamine- aspart (NOVOLOG MIX 70/30) (70-30) 100 UNIT/ML injection Inject 40 Units into the skin 2 (two) times daily with a meal.     insulin detemir (LEVEMIR) 100 UNIT/ML injection Inject 50 Units into the skin 2 (two) times  daily.     Insulin Pen Needle (PEN NEEDLES) 31G X 5 MM MISC PARA USAR CON INYECCION DE INSULINA 4 VECES AL DIA.     JARDIANCE 25 MG TABS tablet Take 25 mg by mouth daily.     losartan (COZAAR) 100 MG tablet Take 100 mg by mouth daily.     metFORMIN (GLUCOPHAGE) 1000 MG tablet Take 1,000 mg by mouth 2 (two) times daily.     metoprolol (TOPROL-XL) 200 MG 24 hr tablet Take 200 mg by mouth daily.     rosuvastatin (CRESTOR) 20 MG tablet SMARTSIG:1 Tablet(s) By Mouth Every Evening     omeprazole (PRILOSEC) 20 MG capsule Take 1 capsule (20 mg total) by mouth 2 (two) times daily before a meal for 14 days. 28 capsule 0   No current facility-administered medications for this visit.    Allergies as of 12/15/2020   (No Known Allergies)    ROS:  General: Negative for anorexia, weight loss, fever, chills, fatigue, weakness. ENT: Negative for hoarseness, difficulty swallowing , nasal congestion. CV: Negative for chest pain, angina, palpitations, dyspnea on exertion, peripheral edema.  Respiratory: Negative for dyspnea at rest, dyspnea on exertion, cough, sputum, wheezing.  GI: See history of present illness. GU:  Negative for dysuria, hematuria, urinary incontinence, urinary frequency, nocturnal urination.  Endo: Negative for unusual weight change.    Physical Examination:   BP (!) 154/89   Pulse 60   Temp 99 F (37.2 C) (Oral)   Wt 177 lb  9.6 oz (80.6 kg)   BMI 29.55 kg/m   General: Well-nourished, well-developed in no acute distress.  Eyes: No icterus. Conjunctivae pink. Neuro: Alert and oriented x 3.  Grossly intact. Skin: Warm and dry, no jaundice.   Psych: Alert and cooperative, normal mood and affect.   Imaging Studies: No results found.  Assessment and Plan:   QUANAH MAJKA is a 60 y.o. y/o male here to follow-up for right upper quadrant pain and iron deficiency anemia.  Also of B12 recently.  EGD showed H. pylori gastritis and intestinal metaplasia.  Colonoscopy showed a few  subcentimeter polyps resected which were adenomas and sessile serrated adenomas.  New onset constipation.  Status post treatment with clarithromycin-based treatment for H. pylori.  HIDA scan showed decreased ejection fraction of 30% referred to Dr. Hampton Abbot and surgery new onset symptoms of GERD after treatment of H. pylori.   Plan  Check H pylori breath test to confirm eradication B12 replacement to be continued EGD in 6 months for gastric mapping for intestinal metaplasia. Follow-up with Dr. Hampton Abbot in general surgery for evaluation for cholecystectomy for concerns of biliary dyskinesia and right upper quadrant pain. Capsule study of the small bowel to complete evaluation of iron deficiency anemia Commenced on omeprazole 40 mg for symptoms of heartburn which is expected after eradication of H. pylori antiparietal cells previously not producing acid due to infection with the bacteria and once the bacteria has been killed start to produce acid once again   Risks, benefits, alternatives of Givens capsule discussed with patient to include but not limited to the rare risk of Given's capsule becoming lodged in the GI tract requiring surgical removal.  The patient agrees with this plan & consent will be obtained.   Dr Jonathon Bellows  MD,MRCP [facility]) Follow up in 8 to 12 weeks

## 2020-12-17 LAB — H. PYLORI BREATH TEST: H pylori Breath Test: NEGATIVE

## 2020-12-21 ENCOUNTER — Encounter: Payer: Self-pay | Admitting: Oncology

## 2020-12-25 ENCOUNTER — Other Ambulatory Visit: Payer: Self-pay

## 2020-12-25 DIAGNOSIS — D509 Iron deficiency anemia, unspecified: Secondary | ICD-10-CM

## 2020-12-28 ENCOUNTER — Inpatient Hospital Stay (HOSPITAL_BASED_OUTPATIENT_CLINIC_OR_DEPARTMENT_OTHER): Payer: BC Managed Care – PPO | Admitting: Oncology

## 2020-12-28 ENCOUNTER — Other Ambulatory Visit: Payer: Self-pay

## 2020-12-28 ENCOUNTER — Inpatient Hospital Stay: Payer: BC Managed Care – PPO

## 2020-12-28 ENCOUNTER — Inpatient Hospital Stay: Payer: BC Managed Care – PPO | Attending: Oncology

## 2020-12-28 ENCOUNTER — Encounter: Payer: Self-pay | Admitting: Oncology

## 2020-12-28 VITALS — BP 128/77 | HR 61 | Temp 98.0°F | Resp 18 | Wt 180.2 lb

## 2020-12-28 DIAGNOSIS — N189 Chronic kidney disease, unspecified: Secondary | ICD-10-CM | POA: Insufficient documentation

## 2020-12-28 DIAGNOSIS — D509 Iron deficiency anemia, unspecified: Secondary | ICD-10-CM | POA: Diagnosis present

## 2020-12-28 DIAGNOSIS — Z7984 Long term (current) use of oral hypoglycemic drugs: Secondary | ICD-10-CM | POA: Insufficient documentation

## 2020-12-28 DIAGNOSIS — Z87891 Personal history of nicotine dependence: Secondary | ICD-10-CM | POA: Insufficient documentation

## 2020-12-28 DIAGNOSIS — D472 Monoclonal gammopathy: Secondary | ICD-10-CM

## 2020-12-28 DIAGNOSIS — Z79899 Other long term (current) drug therapy: Secondary | ICD-10-CM | POA: Insufficient documentation

## 2020-12-28 DIAGNOSIS — Z7982 Long term (current) use of aspirin: Secondary | ICD-10-CM | POA: Insufficient documentation

## 2020-12-28 LAB — CBC WITH DIFFERENTIAL/PLATELET
Abs Immature Granulocytes: 0.04 10*3/uL (ref 0.00–0.07)
Basophils Absolute: 0.1 10*3/uL (ref 0.0–0.1)
Basophils Relative: 1 %
Eosinophils Absolute: 1 10*3/uL — ABNORMAL HIGH (ref 0.0–0.5)
Eosinophils Relative: 12 %
HCT: 42.8 % (ref 39.0–52.0)
Hemoglobin: 14 g/dL (ref 13.0–17.0)
Immature Granulocytes: 1 %
Lymphocytes Relative: 24 %
Lymphs Abs: 2.1 10*3/uL (ref 0.7–4.0)
MCH: 25.5 pg — ABNORMAL LOW (ref 26.0–34.0)
MCHC: 32.7 g/dL (ref 30.0–36.0)
MCV: 78 fL — ABNORMAL LOW (ref 80.0–100.0)
Monocytes Absolute: 0.5 10*3/uL (ref 0.1–1.0)
Monocytes Relative: 5 %
Neutro Abs: 5.1 10*3/uL (ref 1.7–7.7)
Neutrophils Relative %: 57 %
Platelets: 167 10*3/uL (ref 150–400)
RBC: 5.49 MIL/uL (ref 4.22–5.81)
RDW: 17.4 % — ABNORMAL HIGH (ref 11.5–15.5)
WBC: 8.7 10*3/uL (ref 4.0–10.5)
nRBC: 0 % (ref 0.0–0.2)

## 2020-12-28 LAB — IRON AND TIBC
Iron: 91 ug/dL (ref 45–182)
Saturation Ratios: 28 % (ref 17.9–39.5)
TIBC: 325 ug/dL (ref 250–450)
UIBC: 234 ug/dL

## 2020-12-28 LAB — FERRITIN: Ferritin: 70 ng/mL (ref 24–336)

## 2020-12-28 NOTE — Progress Notes (Signed)
Hematology/Oncology Consult note [facility]  Telephone:([phone] [phone]  Patient Care Team: Frazier Richards, MD as PCP - General (Family Medicine)   Name of the patient: Ian Mccormick  [id number]  Jul 31, 1960   Date of visit: 12/28/20  Diagnosis-iron deficiency anemia IgA MGUS  Chief complaint/ Reason for visit-routine follow-up of iron deficiency anemia and IgM-  Heme/Onc history: Patient is a 60 year old Hispanic male who was seen by me in July 2022 for evidence of iron deficiency anemia when his hemoglobin of 10.9.  He received IV iron at that time.  Also sent to GI and had an EGD and colonoscopy done.  He was positive for H. pylori and was treated accordingly.  Subsequently anemia resolved.  As a part of his anemia work-up patient also had myeloma panel checked in July 2022 which showed a mildly elevated IgA level of 714And immunofixation showed 0.4 g of IgA kappa monoclonal protein.  Both kappa and lambda free light chains were elevated with a normal ratio of 1.38.  Patient also has an element of CKD and his creatinine has been around 1.4 for the last 5 to 6 months.  Interval history-patient reports doing well overall.  Denies any specific complaints at this time.  History obtained with the help of Spanish interpreter.  ECOG PS- 1 Pain scale- 0   Review of systems- Review of Systems  Constitutional:  Negative for chills, fever, malaise/fatigue and weight loss.  HENT:  Negative for congestion, ear discharge and nosebleeds.   Eyes:  Negative for blurred vision.  Respiratory:  Negative for cough, hemoptysis, sputum production, shortness of breath and wheezing.   Cardiovascular:  Negative for chest pain, palpitations, orthopnea and claudication.  Gastrointestinal:  Negative for abdominal pain, blood in stool, constipation, diarrhea, heartburn, melena, nausea and vomiting.  Genitourinary:  Negative for dysuria, flank pain, frequency, hematuria  and urgency.  Musculoskeletal:  Negative for back pain, joint pain and myalgias.  Skin:  Negative for rash.  Neurological:  Negative for dizziness, tingling, focal weakness, seizures, weakness and headaches.  Endo/Heme/Allergies:  Does not bruise/bleed easily.  Psychiatric/Behavioral:  Negative for depression and suicidal ideas. The patient does not have insomnia.      No Known Allergies   Past Medical History:  Diagnosis Date   Diabetes mellitus without complication ([place])    Hypertension      Past Surgical History:  Procedure Laterality Date   COLONOSCOPY WITH PROPOFOL N/A 11/09/2020   Procedure: COLONOSCOPY WITH PROPOFOL;  Surgeon: Jonathon Bellows, MD;  Location[facility] END[facility]COPY;  Service: Gastroenterology;  Laterality: N/A;   ESOPHAGOGASTRODUODENOSCOPY (EGD) WITH PROPOFOL N/A 11/09/2020   Procedure: ESOPHAGOGASTRODUODENOSCOPY (EGD) WITH PROPOFOL;  Surgeon: Jonathon Bellows, MD;  Location[facility] ENDOSCOPY;  Service: Gastroenterology;  Laterality: N/A;   EYE SURGERY      Social History   Socioeconomic History   Marital status: Married    Spouse name: Not on file   Number of children: Not on file   Years of education: Not on file   Highest education level: Not on file  Occupational History   Not on file  Tobacco Use   Smoking status: Former    Packs/day: 1.00    Years: 4.00    Pack years: 4.00    Types: Cigarettes    Quit date: 2000    Years since quitting: 22.9   Smokeless tobacco: Never  Vaping Use   Vaping Use: Former  Substance and Sexual Activity   Alcohol use: No  Drug use: No   Sexual activity: Not on file  Other Topics Concern   Not on file  Social History Narrative   Not on file   Social Determinants of Health   Financial Resource Strain: Not on file  Food Insecurity: Not on file  Transportation Needs: Not on file  Physical Activity: Not on file  Stress: Not on file  Social Connections: Not on file  Intimate Partner Violence: Not on file    Family  History  Problem Relation Age of Onset   Hypertension Mother    Diabetes Mother      Current Outpatient Medications:    amLODipine (NORVASC) 10 MG tablet, Take 1 tablet by mouth daily., Disp: , Rfl:    aspirin EC 81 MG tablet, Take 81 mg by mouth daily. Swallow whole., Disp: , Rfl:    hydrochlorothiazide (HYDRODIURIL) 25 MG tablet, Take 25 mg by mouth daily., Disp: , Rfl:    insulin aspart protamine- aspart (NOVOLOG MIX 70/30) (70-30) 100 UNIT/ML injection, Inject 40 Units into the skin 2 (two) times daily with a meal., Disp: , Rfl:    insulin detemir (LEVEMIR) 100 UNIT/ML injection, Inject 50 Units into the skin 2 (two) times daily., Disp: , Rfl:    Insulin Pen Needle (PEN NEEDLES) 31G X 5 MM MISC, PARA USAR CON INYECCION DE INSULINA 4 VECES AL DIA., Disp: , Rfl:    JARDIANCE 25 MG TABS tablet, Take 25 mg by mouth daily., Disp: , Rfl:    losartan (COZAAR) 100 MG tablet, Take 100 mg by mouth daily., Disp: , Rfl:    metFORMIN (GLUCOPHAGE) 1000 MG tablet, Take 1,000 mg by mouth 2 (two) times daily., Disp: , Rfl:    metoprolol (TOPROL-XL) 200 MG 24 hr tablet, Take 200 mg by mouth daily., Disp: , Rfl:    omeprazole (PRILOSEC) 40 MG capsule, Take 1 capsule (40 mg total) by mouth daily., Disp: 90 capsule, Rfl: 0   rosuvastatin (CRESTOR) 20 MG tablet, SMARTSIG:1 Tablet(s) By Mouth Every Evening, Disp: , Rfl:    TRUEplus Lancets 28G MISC, SMARTSIG:1 Topical Daily PRN, Disp: , Rfl:   Physical exam:  Vitals:   12/28/20 1323  BP: 128/77  Pulse: 61  Resp: 18  Temp: 98 F (36.7 C)  SpO2: 97%  Weight: 180 lb 3.2 oz (81.7 kg)   Physical Exam Constitutional:      General: He is not in acute distress. Cardiovascular:     Rate and Rhythm: Normal rate and regular rhythm.     Heart sounds: Normal heart sounds.  Pulmonary:     Effort: Pulmonary effort is normal.     Breath sounds: Normal breath sounds.  Abdominal:     General: Bowel sounds are normal.     Palpations: Abdomen is soft.  Skin:     General: Skin is warm and dry.  Neurological:     Mental Status: He is alert and oriented to person, place, and time.     CMP Latest Ref Rng & Units 10/26/2020  Glucose 70 - 99 mg/dL 47(L)  BUN 8 - 23 mg/dL 33(H)  Creatinine 0.61 - 1.24 mg/dL 1.44(H)  Sodium 135 - 145 mmol/L 137  Potassium 3.5 - 5.1 mmol/L 3.9  Chloride 98 - 111 mmol/L 101  CO2 22 - 32 mmol/L 25  Calcium 8.9 - 10.3 mg/dL 9.8  Total Protein 6.5 - 8.1 g/dL 7.7  Total Bilirubin 0.3 - 1.2 mg/dL 0.6  Alkaline Phos 38 - 126 U/L 76  AST  15 - 41 U/L 22  ALT 0 - 44 U/L 20   CBC Latest Ref Rng & Units 12/28/2020  WBC 4.0 - 10.5 K/uL 8.7  Hemoglobin 13.0 - 17.0 g/dL 14.0  Hematocrit 39.0 - 52.0 % 42.8  Platelets 150 - 400 K/uL 167      Assessment and plan- Patient is a 60 y.o. male who is here for follow-up of following issues:  Iron deficiency anemia: Patient is not presently anemic with a hemoglobin of 14/42.8.  He does have some baseline microcytosis with an MCV of 78.  However iron saturation is normal at 28 with a ferritin level of 70.  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  I will follow-up on this with a repeat myeloma panel in 3 months.  We will consider getting a bone marrow biopsy given that this is a non-IgG MGUS.  However patient is not presently anemic.  He does have some baseline CKD and the fact that both his kappa and lambda light chains are elevated with a normal ratio likely points out that his free light chains are elevated due to CKD.  Labs and see me in 3 months   Visit Diagnosis 1. Iron deficiency anemia, unspecified iron deficiency anemia type   2. MGUS (monoclonal gammopathy of unknown significance)      Dr. Randa Evens, MD, MPH [facility] at [facility] [phone] 12/28/2020 5:05 PM

## 2021-01-04 ENCOUNTER — Other Ambulatory Visit: Payer: Self-pay

## 2021-01-04 ENCOUNTER — Ambulatory Visit: Payer: BC Managed Care – PPO | Admitting: Surgery

## 2021-01-04 ENCOUNTER — Encounter: Payer: Self-pay | Admitting: Surgery

## 2021-01-04 VITALS — BP 167/82 | HR 67 | Temp 99.1°F | Ht 65.0 in | Wt 182.8 lb

## 2021-01-04 DIAGNOSIS — K8020 Calculus of gallbladder without cholecystitis without obstruction: Secondary | ICD-10-CM

## 2021-01-04 DIAGNOSIS — K828 Other specified diseases of gallbladder: Secondary | ICD-10-CM

## 2021-01-04 NOTE — Progress Notes (Signed)
01/04/2021  Reason for Visit: Biliary dyskinesia  Requesting Provider: Jonathon Bellows, MD  History of Present Illness: Ian Mccormick is a 60 y.o. male presenting for evaluation of biliary dyskinesia.  The patient has a history of mild to moderate anemia and has been followed by Dr. Janese Banks and has had IV iron infusions.  As part of the workup for his anemia, he also had a referral to GI and has been seeing Dr. Vicente Males.  He has also reported RUQ pain that radiates to his back.  The pain is not constant but also not necessarily related to po intake, and he reports the pain is not too intense.  He has associated bloatedness.  With Dr. Vicente Males, he had a colonoscopy and EGD.  EGD did not show any gross abnormalities but biopsies were positive for chronic gastritis with H pylori and intestinal metaplasia.  He was treated for H pylori and subsequent test showed resolution.  He had a RUQ U/S which showed some sludge and small stones, and a HIDA scan which shows a mildly decreased EF of 30%.  In light of these findings, he was referred for further evaluation and consideration for cholecystectomy.  Past Medical History: Past Medical History:  Diagnosis Date   Diabetes mellitus without complication ([place])    Hypertension      Past Surgical History: Past Surgical History:  Procedure Laterality Date   COLONOSCOPY WITH PROPOFOL N/A 11/09/2020   Procedure: COLONOSCOPY WITH PROPOFOL;  Surgeon: Jonathon Bellows, MD;  Location: [facility] ENDOSCOPY;  Service: Gastroenterology;  Laterality: N/A;   ESOPHAGOGASTRODUODENOSCOPY (EGD) WITH PROPOFOL N/A 11/09/2020   Procedure: ESOPHAGOGASTRODUODENOSCOPY (EGD) WITH PROPOFOL;  Surgeon: Jonathon Bellows, MD;  Location: [facility] ENDOSCOPY;  Service: Gastroenterology;  Laterality: N/A;   EYE SURGERY      Home Medications: Prior to Admission medications   Medication Sig Start Date End Date Taking? Authorizing Provider  amLODipine (NORVASC) 10 MG tablet Take 1 tablet by mouth daily. 07/29/19  Yes  [provider]  aspirin EC 81 MG tablet Take 81 mg by mouth daily. Swallow whole.   Yes [provider]  hydrochlorothiazide (HYDRODIURIL) 25 MG tablet Take 25 mg by mouth daily. 06/01/20  Yes [provider]  insulin aspart protamine- aspart (NOVOLOG MIX 70/30) (70-30) 100 UNIT/ML injection Inject 40 Units into the skin 2 (two) times daily with a meal.   Yes [provider]  insulin detemir (LEVEMIR) 100 UNIT/ML injection Inject 50 Units into the skin 2 (two) times daily.   Yes [provider]  Insulin Pen Needle (PEN NEEDLES) 31G X 5 MM MISC PARA USAR CON INYECCION DE INSULINA 4 VECES AL DIA. 07/21/20  Yes [provider]  JARDIANCE 25 MG TABS tablet Take 25 mg by mouth daily. 07/30/20  Yes [provider]  losartan (COZAAR) 100 MG tablet Take 100 mg by mouth daily. 07/31/20  Yes [provider]  metFORMIN (GLUCOPHAGE) 1000 MG tablet Take 1,000 mg by mouth 2 (two) times daily. 08/11/20  Yes [provider]  metoprolol (TOPROL-XL) 200 MG 24 hr tablet Take 200 mg by mouth daily. 06/16/20  Yes [provider]  omeprazole (PRILOSEC) 40 MG capsule Take 1 capsule (40 mg total) by mouth daily. 12/15/20  Yes Jonathon Bellows, MD  rosuvastatin (CRESTOR) 20 MG tablet SMARTSIG:1 Tablet(s) By Mouth Every Evening 07/07/20  Yes [provider]  TRUEplus Lancets 28G MISC SMARTSIG:1 Topical Daily PRN 11/12/20  Yes [provider]    Allergies: No Known Allergies  Social  History:  reports that he quit smoking about 22 years ago. His smoking use included cigarettes. He has a 4.00 pack-year smoking history. He has never used smokeless tobacco. He reports that he does not drink alcohol and does not use drugs.   Family History: Family History  Problem Relation Age of Onset   Hypertension Mother    Diabetes Mother     Review of Systems: Review of Systems  Constitutional:  Negative for chills and fever.  HENT:   Negative for hearing loss.   Respiratory:  Negative for shortness of breath.   Cardiovascular:  Negative for chest pain.  Gastrointestinal:  Positive for abdominal pain. Negative for constipation, diarrhea, nausea and vomiting.       Bloatedness and heartburn  Genitourinary:  Negative for dysuria.  Musculoskeletal:  Negative for myalgias.  Neurological:  Negative for dizziness.  Psychiatric/Behavioral:  Negative for depression.    Physical Exam BP (!) 167/82   Pulse 67   Temp 99.1 F (37.3 C) (Oral)   Ht '5\' 5"'  (1.651 m)   Wt 182 lb 12.8 oz (82.9 kg)   SpO2 98%   BMI 30.42 kg/m  CONSTITUTIONAL: No acute distress, well nourished. HEENT:  Normocephalic, atraumatic, extraocular motion intact. NECK: Trachea is midline, and there is no jugular venous distension.  RESPIRATORY:  Lungs are clear, and breath sounds are equal bilaterally. Normal respiratory effort without pathologic use of accessory muscles. CARDIOVASCULAR: Heart is regular without murmurs, gallops, or rubs. GI: The abdomen is soft, non-distended, with mild tenderness in RUQ laterally.  Negative Murphy's sign.  MUSCULOSKELETAL:  Normal muscle strength and tone in all four extremities.  No peripheral edema or cyanosis. SKIN: Skin turgor is normal. There are no pathologic skin lesions.  NEUROLOGIC:  Motor and sensation is grossly normal.  Cranial nerves are grossly intact. PSYCH:  Alert and oriented to person, place and time. Affect is normal.  Laboratory Analysis: Labs from 12/28/20: WBC 8.7, Hgb 14, Hct 42.8, Plt 167.  Labs from 10/26/20: Na 137, K 3.9, Cl 101, CO2 25, BUN 33, Cr 1.44.  Total tibli 0.6, AST 22, ALT 20, Alk Phos 76, Albumin 4.1  Imaging: U/S RUQ 11/03/20: IMPRESSION: Small amount of sludge and stones in the gallbladder with borderline to slight increased wall thickness but negative sonographic Murphy. Findings are nonspecific but can be seen with acute or chronic cholecystitis, liver disease, or edema  forming states. See separately dictated hepatobiliary nuclear medicine scan  HIDA scan 11/03/20: IMPRESSION: 1. Negative for acute gallbladder disease. 2. Decreased gallbladder ejection fraction of 30%, this can be seen with functional gallbladder disease in the appropriate clinical setting   Assessment and Plan: This is a 60 y.o. male with chronic RUQ pain and imaging findings showing sludge/small stones and biliary dyskinesia.  --Discussed with the patient that overall the workup that he has had for RUQ pain has only shown the gallbladder as a possible etiology for his pain.  Although he does have gastritis, he was recently treated for H pylori which has not improved the RUQ pain.  His colonoscopy and EGD were otherwise normal.  He has a Givens capsule study next month to further assess for a source of his anemia.  I think based on the findings, it is likely that his gallbladder is the etiology of his pain, and would agree with cholecystectomy. --Discussed with the patient the surgical plan for a robotic assisted cholecystectomy, and reviewed with him the surgery at length, including the risks of bleeding, infection,  injury to surrounding structures, that this may not resolve all his symptoms, post-operative recovery and pain control as well as activity restrictions, and he's willing to proceed. --Will schedule him for surgery on 01/21/21.  Will send for medical clearance.  He will need to hold his Aspirin for 5 days prior to surgery.  I spent 80 minutes dedicated to the care of this patient on the date of this encounter to include pre-visit review of records, face-to-face time with the patient discussing diagnosis and management, and any post-visit coordination of care.   Melvyn Neth, [facility]

## 2021-01-04 NOTE — H&P (View-Only) (Signed)
01/04/2021  Reason for Visit: Biliary dyskinesia  Requesting Provider: Jonathon Bellows, MD  History of Present Illness: Ian Mccormick is a 60 y.o. male presenting for evaluation of biliary dyskinesia.  The patient has a history of mild to moderate anemia and has been followed by Dr. Janese Banks and has had IV iron infusions.  As part of the workup for his anemia, he also had a referral to GI and has been seeing Dr. Vicente Males.  He has also reported RUQ pain that radiates to his back.  The pain is not constant but also not necessarily related to po intake, and he reports the pain is not too intense.  He has associated bloatedness.  With Dr. Vicente Males, he had a colonoscopy and EGD.  EGD did not show any gross abnormalities but biopsies were positive for chronic gastritis with H pylori and intestinal metaplasia.  He was treated for H pylori and subsequent test showed resolution.  He had a RUQ U/S which showed some sludge and small stones, and a HIDA scan which shows a mildly decreased EF of 30%.  In light of these findings, he was referred for further evaluation and consideration for cholecystectomy.  Past Medical History: Past Medical History:  Diagnosis Date   Diabetes mellitus without complication ([place])    Hypertension      Past Surgical History: Past Surgical History:  Procedure Laterality Date   COLONOSCOPY WITH PROPOFOL N/A 11/09/2020   Procedure: COLONOSCOPY WITH PROPOFOL;  Surgeon: Jonathon Bellows, MD;  Location: [facility] ENDOSCOPY;  Service: Gastroenterology;  Laterality: N/A;   ESOPHAGOGASTRODUODENOSCOPY (EGD) WITH PROPOFOL N/A 11/09/2020   Procedure: ESOPHAGOGASTRODUODENOSCOPY (EGD) WITH PROPOFOL;  Surgeon: Jonathon Bellows, MD;  Location: [facility] ENDOSCOPY;  Service: Gastroenterology;  Laterality: N/A;   EYE SURGERY      Home Medications: Prior to Admission medications   Medication Sig Start Date End Date Taking? Authorizing Provider  amLODipine (NORVASC) 10 MG tablet Take 1 tablet by mouth daily. 07/29/19  Yes  [provider]  aspirin EC 81 MG tablet Take 81 mg by mouth daily. Swallow whole.   Yes [provider]  hydrochlorothiazide (HYDRODIURIL) 25 MG tablet Take 25 mg by mouth daily. 06/01/20  Yes [provider]  insulin aspart protamine- aspart (NOVOLOG MIX 70/30) (70-30) 100 UNIT/ML injection Inject 40 Units into the skin 2 (two) times daily with a meal.   Yes [provider]  insulin detemir (LEVEMIR) 100 UNIT/ML injection Inject 50 Units into the skin 2 (two) times daily.   Yes [provider]  Insulin Pen Needle (PEN NEEDLES) 31G X 5 MM MISC PARA USAR CON INYECCION DE INSULINA 4 VECES AL DIA. 07/21/20  Yes [provider]  JARDIANCE 25 MG TABS tablet Take 25 mg by mouth daily. 07/30/20  Yes [provider]  losartan (COZAAR) 100 MG tablet Take 100 mg by mouth daily. 07/31/20  Yes [provider]  metFORMIN (GLUCOPHAGE) 1000 MG tablet Take 1,000 mg by mouth 2 (two) times daily. 08/11/20  Yes [provider]  metoprolol (TOPROL-XL) 200 MG 24 hr tablet Take 200 mg by mouth daily. 06/16/20  Yes [provider]  omeprazole (PRILOSEC) 40 MG capsule Take 1 capsule (40 mg total) by mouth daily. 12/15/20  Yes Jonathon Bellows, MD  rosuvastatin (CRESTOR) 20 MG tablet SMARTSIG:1 Tablet(s) By Mouth Every Evening 07/07/20  Yes [provider]  TRUEplus Lancets 28G MISC SMARTSIG:1 Topical Daily PRN 11/12/20  Yes [provider]    Allergies: No Known Allergies  Social  History:  reports that he quit smoking about 22 years ago. His smoking use included cigarettes. He has a 4.00 pack-year smoking history. He has never used smokeless tobacco. He reports that he does not drink alcohol and does not use drugs.   Family History: Family History  Problem Relation Age of Onset   Hypertension Mother    Diabetes Mother     Review of Systems: Review of Systems  Constitutional:  Negative for chills and fever.  HENT:   Negative for hearing loss.   Respiratory:  Negative for shortness of breath.   Cardiovascular:  Negative for chest pain.  Gastrointestinal:  Positive for abdominal pain. Negative for constipation, diarrhea, nausea and vomiting.       Bloatedness and heartburn  Genitourinary:  Negative for dysuria.  Musculoskeletal:  Negative for myalgias.  Neurological:  Negative for dizziness.  Psychiatric/Behavioral:  Negative for depression.    Physical Exam BP (!) 167/82   Pulse 67   Temp 99.1 F (37.3 C) (Oral)   Ht 5' 5" (1.651 m)   Wt 182 lb 12.8 oz (82.9 kg)   SpO2 98%   BMI 30.42 kg/m  CONSTITUTIONAL: No acute distress, well nourished. HEENT:  Normocephalic, atraumatic, extraocular motion intact. NECK: Trachea is midline, and there is no jugular venous distension.  RESPIRATORY:  Lungs are clear, and breath sounds are equal bilaterally. Normal respiratory effort without pathologic use of accessory muscles. CARDIOVASCULAR: Heart is regular without murmurs, gallops, or rubs. GI: The abdomen is soft, non-distended, with mild tenderness in RUQ laterally.  Negative Murphy's sign.  MUSCULOSKELETAL:  Normal muscle strength and tone in all four extremities.  No peripheral edema or cyanosis. SKIN: Skin turgor is normal. There are no pathologic skin lesions.  NEUROLOGIC:  Motor and sensation is grossly normal.  Cranial nerves are grossly intact. PSYCH:  Alert and oriented to person, place and time. Affect is normal.  Laboratory Analysis: Labs from 12/28/20: WBC 8.7, Hgb 14, Hct 42.8, Plt 167.  Labs from 10/26/20: Na 137, K 3.9, Cl 101, CO2 25, BUN 33, Cr 1.44.  Total tibli 0.6, AST 22, ALT 20, Alk Phos 76, Albumin 4.1  Imaging: U/S RUQ 11/03/20: IMPRESSION: Small amount of sludge and stones in the gallbladder with borderline to slight increased wall thickness but negative sonographic Murphy. Findings are nonspecific but can be seen with acute or chronic cholecystitis, liver disease, or edema  forming states. See separately dictated hepatobiliary nuclear medicine scan  HIDA scan 11/03/20: IMPRESSION: 1. Negative for acute gallbladder disease. 2. Decreased gallbladder ejection fraction of 30%, this can be seen with functional gallbladder disease in the appropriate clinical setting   Assessment and Plan: This is a 60 y.o. male with chronic RUQ pain and imaging findings showing sludge/small stones and biliary dyskinesia.  --Discussed with the patient that overall the workup that he has had for RUQ pain has only shown the gallbladder as a possible etiology for his pain.  Although he does have gastritis, he was recently treated for H pylori which has not improved the RUQ pain.  His colonoscopy and EGD were otherwise normal.  He has a Givens capsule study next month to further assess for a source of his anemia.  I think based on the findings, it is likely that his gallbladder is the etiology of his pain, and would agree with cholecystectomy. --Discussed with the patient the surgical plan for a robotic assisted cholecystectomy, and reviewed with him the surgery at length, including the risks of bleeding, infection,   including the risks of bleeding, infection, injury to surrounding structures, that this may not resolve all his symptoms, post-operative recovery and pain control as well as activity restrictions, and he's willing to proceed. --Will schedule him for surgery on 01/21/21.  Will send for medical clearance.  He will need to hold his Aspirin for 5 days prior to surgery.  I spent 80 minutes dedicated to the care of this patient on the date of this encounter to include pre-visit review of records, face-to-face time with the patient discussing diagnosis and management, and any post-visit coordination of care.   Melvyn Neth, [facility]

## 2021-01-04 NOTE — Patient Instructions (Addendum)
Stop the Aspirin on 01/15/2021.   Medical Clearance faxed today.   Our surgery scheduler will call to schedule your surgery within 24-48 hours. Please have the [facility] surgery sheet available when speaking with her.    If you have any concerns or questions, please feel free to call our office.   Biliary Dyskinesia Biliary dyskinesia is a condition in which the gallbladder or bile ducts cannot release or move bile normally. Bile is a fluid that is made in the liver to help the body digest food. Bile flows to the gallbladder to be stored. When bile is needed for digestion, it leaves the gallbladder and flows through the bile ducts into the digestive tract. Biliary dyskinesia causes bile to build up, and that can cause pain in the abdomen. This condition may also be called: Acalculous cholecystopathy. Functional gallbladder disorder. Sphincter of Oddi dysfunction. This is one type of biliary dyskinesia. What are the causes? The cause of biliary dyskinesia is poor function of the gallbladder. The exact reason this happens is often unknown. One reason may be changes in the gallbladder that are caused by obesity. What increases the risk? A person is more likely to develop this condition if his or her mother or father had it. It may also develop if the person is: Overweight. Male. 69-65 years old. What are the signs or symptoms? The main symptom of this condition is pain in the upper right side of the abdomen. Typically, the pain: Starts about 30 minutes after a meal, especially a meal that is spicy or greasy. Lasts for 30 minutes or longer. Builds up gradually until it is a steady pain that is severe enough to interrupt daily activities. Other symptoms may include: Nausea or vomiting. Sweating. Cramping or bloating in the abdomen. Heartburn or belching. Diarrhea. How is this diagnosed? This condition may be diagnosed based on your symptoms, your medical history, and a physical exam. You  may have tests to rule out other conditions and to confirm the diagnosis. Tests may include: Blood tests. Ultrasound tests of the gallbladder. Hepatobiliary iminodiacetic acid (HIDA) scan. This is an X-ray test that can show if your gallbladder empties less than a normal amount of bile (gallbladder ejection fraction). MRI or CT scan of the abdomen. ERCP (endoscopic retrograde cholangiopancreatogram). During this procedure, a thin tube with a camera on the end is inserted into the throat and down into areas that need to be examined, such as the pancreas, bile ducts, liver, and gallbladder. Dye is injected into your blood, and then X-rays are done. The dye helps your health care provider see the areas to be examined. ERCP may be done to help diagnose sphincter of Oddi dysfunction. How is this treated? Treatment depends on the cause. Usually, the first step of treatment is to make lifestyle changes. Your health care provider may recommend: Resting. Losing weight. Avoiding foods that are spicy, greasy, or fatty. Taking over-the-counter or prescription pain medicine. In some cases, the condition gets better with lifestyle changes only. However, in many cases, surgery to remove the gallbladder (cholecystectomy) is needed. Follow these instructions at home: Medicines Take over-the-counter and prescription medicines only as told by your health care provider. Ask your health care provider if the medicine prescribed to you: Requires you to avoid driving or using machinery. Can cause constipation. You may need to take these actions to prevent or treat constipation: Drink enough fluid to keep your urine pale yellow. Take over-the-counter or prescription medicines. Eat foods that are high in  fiber, such as beans, whole grains, and fresh fruits and vegetables. Limit foods that are high in fat and processed sugars, such as fried or sweet foods. Alcohol use Alcohol can irritate your stomach and your liver.  If you drink alcohol: Limit how much you have to: 0-1 drink a day for women who are not pregnant. 0-2 drinks a day for men. Know how much alcohol is in a drink. In the [place] one drink equals one 12 oz bottle of beer (355 mL), one 5 oz glass of wine (148 mL), or one 1 oz glass of hard liquor (44 mL). General instructions Rest and return to your normal activities as told by your health care provider. Ask your health care provider what activities are safe for you. Follow instructions from your health care provider about eating or drinking restrictions. You may need to limit fatty, greasy, and spicy foods if they cause symptoms. Do not use any products that contain nicotine or tobacco. These products include cigarettes, chewing tobacco, and vaping devices, such as e-cigarettes. If you need help quitting, ask your health care provider. Smoking can damage your digestive system. Keep all follow-up visits. This is important. Contact a health care provider if: Pain in your abdomen returns. Your symptoms become more severe or continue for more than 3 months. You have any of the following: Nausea or vomiting. Diarrhea. Cramping or bloating in your abdomen. Summary Biliary dyskinesia is a condition in which your gallbladder or bile ducts cannot release or move bile normally. The main symptom of this condition is pain in the upper right side of the abdomen. The pain typically starts about 30 minutes after a meal, especially a meal that is spicy or greasy. Treatment depends on the cause and usually involves lifestyle changes, such as working to lose weight and avoiding certain foods. In many cases, surgery to remove the gallbladder (cholecystectomy) is needed. This information is not intended to replace advice given to you by your health care provider. Make sure you discuss any questions you have with your health care provider. Document Revised: 11/20/2019 Document Reviewed: 11/20/2019 Elsevier Patient  Education  2022 [place].    Minimally Invasive Cholecystectomy Minimally invasive cholecystectomy is surgery to remove the gallbladder. The gallbladder is a pear-shaped organ that lies beneath the liver on the right side of the body. The gallbladder stores bile, which is a fluid that helps the body digest fats. Cholecystectomy is often done to treat inflammation (irritation and swelling) of the gallbladder (cholecystitis). This condition is usually caused by a buildup of gallstones (cholelithiasis) in the gallbladder or when the fluid in the gall bladder becomes stagnant because gallstones get stuck in the ducts (tubes) and block the flow of bile. This can result in inflammation and pain. In severe cases, emergency surgery may be required. This procedure is done through small incisions in the abdomen, instead of one large incision. It is also called laparoscopic surgery. A thin scope with a camera (laparoscope) is inserted through one incision. Then surgical instruments are inserted through the other incisions. In some cases, a minimally invasive surgery may need to be changed to a surgery that is done through a larger incision. This is called open surgery. Tell a health care provider about: Any allergies you have. All medicines you are taking, including vitamins, herbs, eye drops, creams, and over-the-counter medicines. Any problems you or family members have had with anesthetic medicines. Any bleeding problems you have. Any surgeries you have had. Any medical conditions you  have. Whether you are pregnant or may be pregnant. What are the risks? Generally, this is a safe procedure. However, problems may occur, including: Infection. Bleeding. Allergic reactions to medicines. Damage to nearby structures or organs. A gallstone remaining in the common bile duct. The common bile duct carries bile from the gallbladder to the small intestine. A bile leak from the liver or cystic duct after your  gallbladder is removed. What happens before the procedure? When to stop eating and drinking Follow instructions from your health care provider about what you may eat and drink before your procedure. These may include: 8 hours before the procedure Stop eating most foods. Do not eat meat, fried foods, or fatty foods. Eat only light foods, such as toast or crackers. All liquids are okay except energy drinks and alcohol. 6 hours before the procedure Stop eating. Drink only clear liquids, such as water, clear fruit juice, black coffee, plain tea, and sports drinks. Do not drink energy drinks or alcohol. 2 hours before the procedure Stop drinking all liquids. You may be allowed to take medicines with small sips of water. If you do not follow your health care provider's instructions, your procedure may be delayed or canceled. Medicines Ask your health care provider about: Changing or stopping your regular medicines. This is especially important if you are taking diabetes medicines or blood thinners. Taking medicines such as aspirin and ibuprofen. These medicines can thin your blood. Do not take these medicines unless your health care provider tells you to take them. Taking over-the-counter medicines, vitamins, herbs, and supplements. General instructions If you will be going home right after the procedure, plan to have a responsible adult: Take you home from the hospital or clinic. You will not be allowed to drive. Care for you for the time you are told. Do not use any products that contain nicotine or tobacco for at least 4 weeks before the procedure. These products include cigarettes, chewing tobacco, and vaping devices, such as e-cigarettes. If you need help quitting, ask your health care provider. Ask your health care provider: How your surgery site will be marked. What steps will be taken to help prevent infection. These may include: Removing hair at the surgery site. Washing skin with a  germ-killing soap. Taking antibiotic medicine. What happens during the procedure?  An IV will be inserted into one of your veins. You will be given one or both of the following: A medicine to help you relax (sedative). A medicine to make you fall asleep (general anesthetic). Your surgeon will make several small incisions in your abdomen. The laparoscope will be inserted through one of the small incisions. The camera on the laparoscope will send images to a monitor in the operating room. This lets your surgeon see inside your abdomen. A gas will be pumped into your abdomen. This will expand your abdomen to give the surgeon more room to perform the surgery. Other tools that are needed for the procedure will be inserted through the other incisions. The gallbladder will be removed through one of the incisions. Your common bile duct may be examined. If stones are found in the common bile duct, they may be removed. After your gallbladder has been removed, the incisions will be closed with stitches (sutures), staples, or skin glue. Your incisions will be covered with a bandage (dressing). The procedure may vary among health care providers and hospitals. What happens after the procedure? Your blood pressure, heart rate, breathing rate, and blood oxygen level will be  monitored until you leave the hospital or clinic. You will be given medicines as needed to control your pain. You may have a drain placed in the incision. The drain will be removed a day or two after the procedure. Summary Minimally invasive cholecystectomy, also called laparoscopic cholecystectomy, is surgery to remove the gallbladder using small incisions. Tell your health care provider about all the medical conditions you have and all the medicines you are taking for those conditions. Before the procedure, follow instructions about when to stop eating and drinking and changing or stopping medicines. Plan to have a responsible adult  care for you for the time you are told after you leave the hospital or clinic. This information is not intended to replace advice given to you by your health care provider. Make sure you discuss any questions you have with your health care provider. Document Revised: 07/28/2020 Document Reviewed: 07/28/2020 Elsevier Patient Education  [place].

## 2021-01-05 ENCOUNTER — Telehealth: Payer: Self-pay

## 2021-01-05 ENCOUNTER — Telehealth: Payer: Self-pay | Admitting: Surgery

## 2021-01-05 NOTE — Telephone Encounter (Signed)
Faxed medical clearance to Dr. Beverlyn Roux at [phone].

## 2021-01-05 NOTE — Telephone Encounter (Signed)
Used language services, spoke with interpreter, Lorriane Shire ID# [id number] who interpreted for me.     Patient has been advised of Pre-Admission date/time, COVID Testing date and Surgery date.  Surgery Date: 01/21/21 Preadmission Testing Date: 01/13/21 (phone 8a-1p) Covid Testing Date: Not needed.     Patient has been made aware to call [phone], between 1-3:00pm the day before surgery, to find out what time to arrive for surgery.

## 2021-01-11 ENCOUNTER — Encounter: Payer: Self-pay | Admitting: Gastroenterology

## 2021-01-12 ENCOUNTER — Encounter
Admission: RE | Disposition: A | Discharge status: Home / Self Care | Payer: Self-pay | Source: Home / Self Care | Attending: Gastroenterology

## 2021-01-12 ENCOUNTER — Ambulatory Visit
Admission: RE | Admit: 2021-01-12 | Discharge: 2021-01-12 | Disposition: A | Discharge status: Home / Self Care | Payer: BC Managed Care – PPO | Attending: Gastroenterology | Admitting: Gastroenterology

## 2021-01-12 ENCOUNTER — Encounter: Payer: Self-pay | Admitting: Anesthesiology

## 2021-01-12 DIAGNOSIS — E119 Type 2 diabetes mellitus without complications: Secondary | ICD-10-CM | POA: Diagnosis not present

## 2021-01-12 DIAGNOSIS — Z01818 Encounter for other preprocedural examination: Secondary | ICD-10-CM | POA: Insufficient documentation

## 2021-01-12 DIAGNOSIS — D509 Iron deficiency anemia, unspecified: Secondary | ICD-10-CM | POA: Diagnosis not present

## 2021-01-12 DIAGNOSIS — I10 Essential (primary) hypertension: Secondary | ICD-10-CM | POA: Diagnosis not present

## 2021-01-12 HISTORY — PX: GIVENS CAPSULE STUDY: SHX5432

## 2021-01-12 SURGERY — IMAGING PROCEDURE, GI TRACT, INTRALUMINAL, VIA CAPSULE

## 2021-01-13 ENCOUNTER — Other Ambulatory Visit
Admission: RE | Admit: 2021-01-13 | Discharge: 2021-01-13 | Disposition: A | Discharge status: Home / Self Care | Payer: BC Managed Care – PPO | Source: Ambulatory Visit | Attending: Surgery | Admitting: Surgery

## 2021-01-13 ENCOUNTER — Other Ambulatory Visit: Payer: Self-pay

## 2021-01-13 VITALS — Ht 65.0 in | Wt 180.0 lb

## 2021-01-13 DIAGNOSIS — Z01812 Encounter for preprocedural laboratory examination: Secondary | ICD-10-CM | POA: Insufficient documentation

## 2021-01-13 DIAGNOSIS — I10 Essential (primary) hypertension: Secondary | ICD-10-CM

## 2021-01-13 DIAGNOSIS — Z01818 Encounter for other preprocedural examination: Secondary | ICD-10-CM | POA: Diagnosis not present

## 2021-01-13 DIAGNOSIS — E119 Type 2 diabetes mellitus without complications: Secondary | ICD-10-CM

## 2021-01-13 HISTORY — DX: Anemia, unspecified: D64.9

## 2021-01-13 LAB — BASIC METABOLIC PANEL
Anion gap: 9 (ref 5–15)
BUN: 31 mg/dL — ABNORMAL HIGH (ref 8–23)
CO2: 27 mmol/L (ref 22–32)
Calcium: 9.5 mg/dL (ref 8.9–10.3)
Chloride: 99 mmol/L (ref 98–111)
Creatinine, Ser: 1.48 mg/dL — ABNORMAL HIGH (ref 0.61–1.24)
GFR, Estimated: 53 mL/min — ABNORMAL LOW (ref >60)
Glucose, Bld: 213 mg/dL — ABNORMAL HIGH (ref 70–99)
Potassium: 4.1 mmol/L (ref 3.5–5.1)
Sodium: 135 mmol/L (ref 135–145)

## 2021-01-13 NOTE — Patient Instructions (Addendum)
Your procedure is scheduled on: Thursday January 21, 2021. Su procedimiento est programado para: Jueves 15 de Diciembre del 2022. Report to Day Surgery inside [facility] 2nd floor. Presntese a: Science writer del [facility] 2do piso. To find out your arrival time please call [phone] between 1PM - 3PM on Wednesday January 20, 2021. Para saber su hora de llegada por favor llame al [phone] entre la 1PM - 3PM el da: [place] 2022.  Remember: Instructions that are not followed completely may result in serious medical risk, up to and including death,  or upon the discretion of your surgeon and anesthesiologist your surgery may need to be rescheduled.  Recuerde: Las instrucciones que no se siguen completamente Heritage manager en un riesgo de salud grave, incluyendo hasta  la [place] o a discrecin de su cirujano y Environmental health practitioner, su ciruga se puede posponer.   __X_ 1.Do not eat food after midnight the night before your procedure. No    gum chewing or hard candies. You may drink clear liquids up to 2 hours     before you are scheduled to arrive for your surgery- DO not drink clear     Liquids within 2 hours of the start of your surgery.     Clear Liquids include:    water     No coma nada despus de la medianoche de la noche anterior a su    procedimiento. No coma chicles ni caramelos duros. Puede tomar    lquidos claros hasta 2 horas antes de su hora programada de llegada al     hospital para su procedimiento. No tome lquidos claros durante el     transcurso de las 2 horas de su llegada programada al hospital para su     procedimiento, ya que esto puede llevar a que su procedimiento se    retrase o tenga que volver a Health and safety inspector.  Los lquidos claros incluyen:          - Agua   No tome nada que no est en esta lista.  Los pacientes con diabetes tipo 1 y tipo 2 solo deben Agricultural engineer.  Llame a la clnica de PreCare o a la unidad de Same  Day Surgery si  tiene alguna pregunta sobre estas instrucciones.              _X__ 2.Do Not Smoke or use e-cigarettes For 24 Hours Prior to Your Surgery.    Do not use any chewable tobacco products for at least 6   hours prior to surgery.    No fume ni use cigarrillos electrnicos durante las 24 horas previas    a su [place].  No use ningn producto de tabaco masticable durante   al menos 6 horas antes de la [place].     __X_ 3. No alcohol for 24 hours before or after surgery.    No tome alcohol durante las 24 horas antes ni despus de la [place].   __X__4. On the morning of surgery brush your teeth with toothpaste and water, you                may rinse your mouth with mouthwash if you wish.  Do not swallow any toothpaste of mouthwash.   En la maana de la [place], cepllese los dientes con pasta de dientes y [place],                [place] enjuagarse la boca con enjuague bucal si lo desea.  No ingiera ninguna pasta de dientes o enjuague bucal.   __X__ 5. Notify your doctor if there is any change in your medical condition (cold,fever, infections).    Informe a su mdico si hay algn cambio en su condicin mdica  (resfriado, fiebre, infecciones).   Do not wear jewelry, make-up, hairpins, clips or nail polish.  No use joyas, maquillajes, pinzas/ganchos para el cabello ni esmalte de uas.  Do not wear lotions, powders, or perfumes. You may wear deodorant.  No use lociones, polvos o perfumes.  Puede usar desodorante.    Do not shave 48 hours prior to surgery. Men may shave face and neck.  No se afeite 48 horas antes de la [place].  Los hombres pueden [employer] cara  y el cuello.   Do not bring valuables to the hospital.   No lleve objetos [facility] [facility] is not responsible for any belongings or valuables.  [place] no se hace responsable de ningn tipo de pertenencias u objetos de Geographical information systems officer.               Contacts, dentures or bridgework may not be worn into  surgery.  Los lentes de [place], las dentaduras postizas o puentes no se pueden usar en la [place].   Leave your suitcase in the car. After surgery it may be brought to your room.  Deje su maleta en el auto.  Despus de la ciruga podr traerla a su habitacin.   For patients admitted to the hospital, discharge time is determined by your  treatment team.  Para los pacientes que sean ingresados al hospital, el tiempo en el cual se le  dar de alta es determinado por su equipo de [place].   Patients discharged the day of surgery will not be allowed to drive home. A los pacientes que se les da de alta el mismo da de la ciruga no se les permitir conducir a Holiday representative.   __X__ Take these medicines the morning of surgery with A SIP OF WATER:          Tome estas medicinas la maana de la ciruga con UN SORBO DE AGUA:  1. metoprolol (TOPROL-XL) 200 MG  2. amLODipine (NORVASC) 10 MG  3. rosuvastatin (CRESTOR) 20 MG   4. omeprazole (PRILOSEC) 40 MG   5.  6.  ____ Fleet Enema (as directed)          Enema de Fleet (segn lo indicado)    __X__ Use CHG Soap as directed          Utilice el jabn de CHG segn lo indicado  ____ Use inhalers on the day of surgery          Use los inhaladores el da de la ciruga  __X__ Stop JARDIANCE 25 MG TABS  3 days prior to surgery (take last dose Sunday 01/17/21)  Deje de tomar el JARDIANCE 25 MG TABS 3 das antes de la ciruga (tomese la ultima [place] 01/17/21)   __X__ Stop  metFORMIN (GLUCOPHAGE) 1000 MG 2 days prior to surgery (take last dose Monday 01/18/21)          Deje de tomar el metFORMIN (GLUCOPHAGE) 1000 MG 2 das antes de la ciruga (tomese la ultima pastilla el Lunes 01/18/21)   __X__ Take 1/2 of usual insulin dose the night before surgery and none on the morning of surgery           Tome la mitad de la dosis  habitual de insulina la noche antes de la [place] y no tome nada en la maana de la Cirug[place]               insulin  aspart protamine- aspart (NOVOLOG MIX 70/30) (70-30) 100 UNIT/ML injection   insulin detemir (LEVEMIR) 100 UNIT/ML injection  ___X_ Stop aspirin 81 mg 01/15/21 as instructed by your doctor.           Deje de tomar aspirina 81 mg el da 01/15/21 como le instruyo su doctor.  __X__ Stop Anti-inflammatories sucjh as Ibuprofen, Aleve, Advil, naproxen, Midol, Toradol or Goody's or BC powders.          Deje de tomar antiinflamatorios como Ibuprofen, Aleve, Advil, naproxen, Midol, Toradol o polvos de Goody's or BC powders.   __X__ Stop supplements until after surgery            Deje de tomar suplementos hasta despus de la ciruga  ____ Bring C-Pap to the hospital          Index

## 2021-01-18 ENCOUNTER — Telehealth: Payer: Self-pay | Admitting: *Deleted

## 2021-01-18 NOTE — Telephone Encounter (Signed)
Faxed FMLA to [phone]

## 2021-01-20 NOTE — Progress Notes (Unsigned)
Medical clearance has been received from Dr Quinn Plowman office. The patient is cleared at Medium risk.

## 2021-01-21 ENCOUNTER — Ambulatory Visit: Payer: BC Managed Care – PPO | Admitting: Anesthesiology

## 2021-01-21 ENCOUNTER — Encounter
Admission: RE | Disposition: A | Discharge status: Home / Self Care | Payer: Self-pay | Source: Home / Self Care | Attending: Surgery

## 2021-01-21 ENCOUNTER — Ambulatory Visit: Payer: BC Managed Care – PPO | Admitting: Urgent Care

## 2021-01-21 ENCOUNTER — Encounter: Payer: Self-pay | Admitting: Surgery

## 2021-01-21 ENCOUNTER — Ambulatory Visit
Admission: RE | Admit: 2021-01-21 | Discharge: 2021-01-21 | Disposition: A | Discharge status: Home / Self Care | Payer: BC Managed Care – PPO | Attending: Surgery | Admitting: Surgery

## 2021-01-21 DIAGNOSIS — K8010 Calculus of gallbladder with chronic cholecystitis without obstruction: Secondary | ICD-10-CM | POA: Insufficient documentation

## 2021-01-21 DIAGNOSIS — D649 Anemia, unspecified: Secondary | ICD-10-CM | POA: Insufficient documentation

## 2021-01-21 DIAGNOSIS — K828 Other specified diseases of gallbladder: Secondary | ICD-10-CM | POA: Diagnosis not present

## 2021-01-21 DIAGNOSIS — E119 Type 2 diabetes mellitus without complications: Secondary | ICD-10-CM | POA: Insufficient documentation

## 2021-01-21 LAB — GLUCOSE, CAPILLARY
Glucose-Capillary: 263 mg/dL — ABNORMAL HIGH (ref 70–99)
Glucose-Capillary: 237 mg/dL — ABNORMAL HIGH (ref 70–99)

## 2021-01-21 SURGERY — CHOLECYSTECTOMY, ROBOT-ASSISTED, LAPAROSCOPIC
Anesthesia: General

## 2021-01-21 MED ORDER — INSULIN ASPART 100 UNIT/ML IJ SOLN
INTRAMUSCULAR | Status: AC
  Filled 2021-01-21: qty 1

## 2021-01-21 MED ORDER — ROCURONIUM BROMIDE 10 MG/ML (PF) SYRINGE
PREFILLED_SYRINGE | INTRAVENOUS | Status: AC
  Filled 2021-01-21: qty 10

## 2021-01-21 MED ORDER — ACETAMINOPHEN 500 MG PO TABS
ORAL_TABLET | ORAL | Status: AC
  Filled 2021-01-21: qty 2

## 2021-01-21 MED ORDER — BUPIVACAINE-EPINEPHRINE (PF) 0.25% -1:200000 IJ SOLN
INTRAMUSCULAR | Status: DC | PRN
  Administered 2021-01-21: 30 mL

## 2021-01-21 MED ORDER — OXYCODONE HCL 5 MG/5ML PO SOLN: 5.0000 mg | Freq: Once | ORAL | Status: DC | PRN

## 2021-01-21 MED ORDER — FENTANYL CITRATE (PF) 100 MCG/2ML IJ SOLN: 25.0000 ug | INTRAMUSCULAR | Status: DC | PRN

## 2021-01-21 MED ORDER — ONDANSETRON HCL 4 MG/2ML IJ SOLN
INTRAMUSCULAR | Status: DC | PRN
  Administered 2021-01-21: 4 mg via INTRAVENOUS

## 2021-01-21 MED ORDER — SODIUM CHLORIDE 0.9 % IV SOLN: INTRAVENOUS | Status: DC

## 2021-01-21 MED ORDER — SUGAMMADEX SODIUM 200 MG/2ML IV SOLN
INTRAVENOUS | Status: DC | PRN
  Administered 2021-01-21: 150 mg via INTRAVENOUS
  Administered 2021-01-21: 200 mg via INTRAVENOUS

## 2021-01-21 MED ORDER — BUPIVACAINE-EPINEPHRINE (PF) 0.25% -1:200000 IJ SOLN
INTRAMUSCULAR | Status: AC
  Filled 2021-01-21: qty 30

## 2021-01-21 MED ORDER — FENTANYL CITRATE (PF) 100 MCG/2ML IJ SOLN
INTRAMUSCULAR | Status: AC
  Filled 2021-01-21: qty 2

## 2021-01-21 MED ORDER — EPHEDRINE 5 MG/ML INJ
INTRAVENOUS | Status: AC
  Filled 2021-01-21: qty 5

## 2021-01-21 MED ORDER — MIDAZOLAM HCL 2 MG/2ML IJ SOLN
INTRAMUSCULAR | Status: AC
  Filled 2021-01-21: qty 2

## 2021-01-21 MED ORDER — MIDAZOLAM HCL 2 MG/2ML IJ SOLN
INTRAMUSCULAR | Status: DC | PRN
  Administered 2021-01-21: 2 mg via INTRAVENOUS

## 2021-01-21 MED ORDER — ACETAMINOPHEN 500 MG PO TABS: 1000.0000 mg | ORAL_TABLET | Freq: Four times a day (QID) | ORAL | Status: AC | PRN

## 2021-01-21 MED ORDER — CEFAZOLIN SODIUM-DEXTROSE 2-4 GM/100ML-% IV SOLN
INTRAVENOUS | Status: AC
  Filled 2021-01-21: qty 100

## 2021-01-21 MED ORDER — CHLORHEXIDINE GLUCONATE 0.12 % MT SOLN
15.0000 mL | Freq: Once | OROMUCOSAL | Status: AC
  Administered 2021-01-21: 15 mL via OROMUCOSAL

## 2021-01-21 MED ORDER — ORAL CARE MOUTH RINSE: 15.0000 mL | Freq: Once | OROMUCOSAL | Status: AC

## 2021-01-21 MED ORDER — ONDANSETRON HCL 4 MG/2ML IJ SOLN
INTRAMUSCULAR | Status: AC
  Filled 2021-01-21: qty 2

## 2021-01-21 MED ORDER — ROCURONIUM BROMIDE 100 MG/10ML IV SOLN
INTRAVENOUS | Status: DC | PRN
  Administered 2021-01-21: 20 mg via INTRAVENOUS
  Administered 2021-01-21: 60 mg via INTRAVENOUS

## 2021-01-21 MED ORDER — GABAPENTIN 300 MG PO CAPS
ORAL_CAPSULE | ORAL | Status: AC
  Filled 2021-01-21: qty 1

## 2021-01-21 MED ORDER — OXYCODONE HCL 5 MG PO TABS: 5.0000 mg | ORAL_TABLET | Freq: Four times a day (QID) | ORAL | 0 refills | Status: DC | PRN

## 2021-01-21 MED ORDER — OXYCODONE HCL 5 MG PO TABS: 5.0000 mg | ORAL_TABLET | Freq: Once | ORAL | Status: DC | PRN

## 2021-01-21 MED ORDER — ONDANSETRON HCL 4 MG/2ML IJ SOLN: 4.0000 mg | Freq: Once | INTRAMUSCULAR | Status: DC | PRN

## 2021-01-21 MED ORDER — LIDOCAINE HCL (CARDIAC) PF 100 MG/5ML IV SOSY
PREFILLED_SYRINGE | INTRAVENOUS | Status: DC | PRN
  Administered 2021-01-21: 20 mg via INTRAVENOUS
  Administered 2021-01-21: 80 mg via INTRAVENOUS

## 2021-01-21 MED ORDER — CHLORHEXIDINE GLUCONATE 0.12 % MT SOLN
OROMUCOSAL | Status: AC
  Filled 2021-01-21: qty 15

## 2021-01-21 MED ORDER — GABAPENTIN 300 MG PO CAPS
300.0000 mg | ORAL_CAPSULE | ORAL | Status: AC
  Administered 2021-01-21: 300 mg via ORAL

## 2021-01-21 MED ORDER — PROPOFOL 10 MG/ML IV BOLUS
INTRAVENOUS | Status: AC
  Filled 2021-01-21: qty 20

## 2021-01-21 MED ORDER — EPHEDRINE SULFATE 50 MG/ML IJ SOLN
INTRAMUSCULAR | Status: DC | PRN
  Administered 2021-01-21: 10 mg via INTRAVENOUS

## 2021-01-21 MED ORDER — PROPOFOL 10 MG/ML IV BOLUS
INTRAVENOUS | Status: DC | PRN
  Administered 2021-01-21: 120 mg via INTRAVENOUS

## 2021-01-21 MED ORDER — INDOCYANINE GREEN 25 MG IV SOLR
2.5000 mg | INTRAVENOUS | Status: AC
  Administered 2021-01-21: 2.5 mg via INTRAVENOUS
  Filled 2021-01-21: qty 1

## 2021-01-21 MED ORDER — CHLORHEXIDINE GLUCONATE CLOTH 2 % EX PADS: 6.0000 | MEDICATED_PAD | Freq: Once | CUTANEOUS | Status: DC

## 2021-01-21 MED ORDER — GLYCOPYRROLATE 0.2 MG/ML IJ SOLN
INTRAMUSCULAR | Status: DC | PRN
  Administered 2021-01-21: .2 mg via INTRAVENOUS

## 2021-01-21 MED ORDER — ACETAMINOPHEN 500 MG PO TABS
1000.0000 mg | ORAL_TABLET | ORAL | Status: AC
  Administered 2021-01-21: 1000 mg via ORAL

## 2021-01-21 MED ORDER — CHLORHEXIDINE GLUCONATE CLOTH 2 % EX PADS
6.0000 | MEDICATED_PAD | Freq: Once | CUTANEOUS | Status: AC
  Administered 2021-01-21: 6 via TOPICAL

## 2021-01-21 MED ORDER — CEFAZOLIN SODIUM-DEXTROSE 2-4 GM/100ML-% IV SOLN
2.0000 g | INTRAVENOUS | Status: AC
  Administered 2021-01-21: 2 g via INTRAVENOUS

## 2021-01-21 MED ORDER — FENTANYL CITRATE (PF) 100 MCG/2ML IJ SOLN
INTRAMUSCULAR | Status: DC | PRN
  Administered 2021-01-21 (×2): 50 ug via INTRAVENOUS

## 2021-01-21 MED ORDER — LIDOCAINE HCL (PF) 2 % IJ SOLN
INTRAMUSCULAR | Status: AC
  Filled 2021-01-21: qty 5

## 2021-01-21 MED ORDER — INSULIN ASPART 100 UNIT/ML IJ SOLN
6.0000 [IU] | Freq: Once | INTRAMUSCULAR | Status: AC
  Administered 2021-01-21: 6 [IU] via SUBCUTANEOUS

## 2021-01-21 SURGICAL SUPPLY — 59 items
ADH SKN CLS APL DERMABOND .7 (GAUZE/BANDAGES/DRESSINGS) ×1
BAG INFUSER PRESSURE 100CC (MISCELLANEOUS) IMPLANT
BAG SPEC RTRVL LRG 6X4 10 (ENDOMECHANICALS) ×1
CANNULA REDUC XI 12-8 STAPL (CANNULA) ×1
CANNULA REDUCER 12-8 DVNC XI (CANNULA) ×1 IMPLANT
CLIP LIGATING HEMO O LOK GREEN (MISCELLANEOUS) ×2 IMPLANT
CUP MEDICINE 2OZ PLAST GRAD ST (MISCELLANEOUS) ×1 IMPLANT
DECANTER SPIKE VIAL GLASS SM (MISCELLANEOUS) ×2 IMPLANT
DERMABOND ADVANCED (GAUZE/BANDAGES/DRESSINGS) ×1
DERMABOND ADVANCED .7 DNX12 (GAUZE/BANDAGES/DRESSINGS) ×1 IMPLANT
DRAPE ARM DVNC X/XI (DISPOSABLE) ×4 IMPLANT
DRAPE COLUMN DVNC XI (DISPOSABLE) ×1 IMPLANT
DRAPE DA VINCI XI ARM (DISPOSABLE) ×4
DRAPE DA VINCI XI COLUMN (DISPOSABLE) ×1
ELECT CAUTERY BLADE TIP 2.5 (TIP) ×2
ELECT REM PT RETURN 9FT ADLT (ELECTROSURGICAL) ×2
ELECTRODE CAUTERY BLDE TIP 2.5 (TIP) ×1 IMPLANT
ELECTRODE REM PT RTRN 9FT ADLT (ELECTROSURGICAL) ×1 IMPLANT
GAUZE 4X4 16PLY ~~LOC~~+RFID DBL (SPONGE) ×2 IMPLANT
GLOVE SURG SYN 7.0 (GLOVE) ×4 IMPLANT
GLOVE SURG SYN 7.0 PF PI (GLOVE) ×2 IMPLANT
GLOVE SURG SYN 7.5  E (GLOVE) ×2
GLOVE SURG SYN 7.5 E (GLOVE) ×2 IMPLANT
GLOVE SURG SYN 7.5 PF PI (GLOVE) ×2 IMPLANT
GOWN STRL REUS W/ TWL LRG LVL3 (GOWN DISPOSABLE) ×4 IMPLANT
GOWN STRL REUS W/TWL LRG LVL3 (GOWN DISPOSABLE) ×8
IRRIGATION STRYKERFLOW (MISCELLANEOUS) IMPLANT
IRRIGATOR STRYKERFLOW (MISCELLANEOUS) ×2
IRRIGATOR SUCT 8 DISP DVNC XI (IRRIGATION / IRRIGATOR) IMPLANT
IRRIGATOR SUCTION 8MM XI DISP (IRRIGATION / IRRIGATOR)
IV NS 1000ML (IV SOLUTION)
IV NS 1000ML BAXH (IV SOLUTION) IMPLANT
KIT PINK PAD W/HEAD ARE REST (MISCELLANEOUS) ×2
KIT PINK PAD W/HEAD ARM REST (MISCELLANEOUS) ×1 IMPLANT
LABEL OR SOLS (LABEL) ×2 IMPLANT
MANIFOLD NEPTUNE II (INSTRUMENTS) ×2 IMPLANT
NEEDLE HYPO 22GX1.5 SAFETY (NEEDLE) ×2 IMPLANT
NS IRRIG 500ML POUR BTL (IV SOLUTION) ×2 IMPLANT
OBTURATOR OPTICAL STANDARD 8MM (TROCAR) ×1
OBTURATOR OPTICAL STND 8 DVNC (TROCAR) ×1
OBTURATOR OPTICALSTD 8 DVNC (TROCAR) ×1 IMPLANT
PACK LAP CHOLECYSTECTOMY (MISCELLANEOUS) ×2 IMPLANT
PENCIL ELECTRO HAND CTR (MISCELLANEOUS) ×2 IMPLANT
POUCH SPECIMEN RETRIEVAL 10MM (ENDOMECHANICALS) ×2 IMPLANT
SEAL CANN UNIV 5-8 DVNC XI (MISCELLANEOUS) ×3 IMPLANT
SEAL XI 5MM-8MM UNIVERSAL (MISCELLANEOUS) ×3
SET TUBE SMOKE EVAC HIGH FLOW (TUBING) ×2 IMPLANT
SOLUTION ELECTROLUBE (MISCELLANEOUS) ×2 IMPLANT
SPONGE T-LAP 18X18 ~~LOC~~+RFID (SPONGE) IMPLANT
SPONGE T-LAP 4X18 ~~LOC~~+RFID (SPONGE) ×1 IMPLANT
STAPLER CANNULA SEAL DVNC XI (STAPLE) ×1 IMPLANT
STAPLER CANNULA SEAL XI (STAPLE) ×1
SUT MNCRL AB 4-0 PS2 18 (SUTURE) ×2 IMPLANT
SUT VIC AB 3-0 SH 27 (SUTURE)
SUT VIC AB 3-0 SH 27X BRD (SUTURE) IMPLANT
SUT VICRYL 0 AB UR-6 (SUTURE) ×4 IMPLANT
TAPE TRANSPORE STRL 2 31045 (GAUZE/BANDAGES/DRESSINGS) ×2 IMPLANT
TROCAR BALLN GELPORT 12X130M (ENDOMECHANICALS) ×2 IMPLANT
WATER STERILE IRR 500ML POUR (IV SOLUTION) ×1 IMPLANT

## 2021-01-21 NOTE — Anesthesia Procedure Notes (Signed)
Procedure Name: Intubation Date/Time: 01/21/2021 7:39 AM Performed by: Debe Coder, CRNA Pre-anesthesia Checklist: Patient identified, Emergency Drugs available, Suction available and Patient being monitored Patient Re-evaluated:Patient Re-evaluated prior to induction Oxygen Delivery Method: Circle system utilized Preoxygenation: Pre-oxygenation with 100% oxygen Induction Type: IV induction Ventilation: Mask ventilation without difficulty Laryngoscope Size: Mac and 3 Grade View: Grade I Tube type: Oral Tube size: 7.0 mm Number of attempts: 1 Airway Equipment and Method: Stylet and Oral airway Placement Confirmation: ETT inserted through vocal cords under direct vision, positive ETCO2 and breath sounds checked- equal and bilateral Secured at: 21 cm Tube secured with: Tape Dental Injury: Teeth and Oropharynx as per pre-operative assessment

## 2021-01-21 NOTE — Discharge Instructions (Signed)
CIRUGIA AMBULATORIA       Instruccionnes de alta      1.  Las drogas que se Statistician en su cuerpo [employer], asi            que por las proximas 24 horas usted no debe:   Conducir Scientist, research (medical)) un automovil   Hacer ninguna decision legal   Tomar ninguna bebida alcoholica  2.  A) Manana puede comenzar una dieta regular.  Es mejor que hoy empiece con           liquidos y gradualmente anada comidas solidas.       B) Puede comer cualquier comida que desee pero es mejor empezar con liquidos,                      luego sopitas con galletas saladas y gradualmente llegar a las comidas solidas.  3.  Por favor avise a su medico inmediatamente si usted tiene algun sangrado anormal,       tiene dificultad con la respiracion, enrojecimiento y Social research officer, government en el sitio de la cirugia, [place],       fiebro o dolor que se alivia con [place].  4.  A) Su visita posoperatoria (despues de su operacion) es con el          B)  Por favor llame para hacer la cita posoperatoria.  5.  Istrucciones especificas :

## 2021-01-21 NOTE — Transfer of Care (Signed)
Immediate Anesthesia Transfer of Care Note  Patient: Ian Mccormick  Procedure(s) Performed: XI ROBOTIC ASSISTED LAPAROSCOPIC CHOLECYSTECTOMY INDOCYANINE GREEN FLUORESCENCE IMAGING (ICG)  Patient Location: PACU  Anesthesia Type:General  Level of Consciousness: awake, alert  and oriented  Airway & Oxygen Therapy: Patient Spontanous Breathing  Post-op Assessment: Report given to RN and Post -op Vital signs reviewed and stable  Post vital signs: Reviewed and stable  Last Vitals:  Vitals Value Taken Time  BP 148/80 01/21/21 0923  Temp    Pulse 64 01/21/21 0928  Resp 17 01/21/21 0928  SpO2 98 % 01/21/21 0928  Vitals shown include unvalidated device data.  Last Pain: There were no vitals filed for this visit.       Complications: No notable events documented.

## 2021-01-21 NOTE — Anesthesia Preprocedure Evaluation (Signed)
Anesthesia Evaluation  Patient identified by MRN, date of birth, ID band Patient awake    Reviewed: Allergy & Precautions, NPO status , Patient's Chart, lab work & pertinent test results  History of Anesthesia Complications Negative for: history of anesthetic complications  Airway Mallampati: III  TM Distance: >3 FB Neck ROM: Full    Dental  (+) Edentulous Upper, Partial Lower   Pulmonary neg pulmonary ROS, neg sleep apnea, neg COPD, Patient abstained from smoking.Not current smoker, former smoker,    Pulmonary exam normal breath sounds clear to auscultation       Cardiovascular Exercise Tolerance: Good METShypertension, Pt. on medications (-) CAD and (-) Past MI (-) dysrhythmias  Rhythm:Regular Rate:Normal - Systolic murmurs    Neuro/Psych negative neurological ROS  negative psych ROS   GI/Hepatic neg GERD  ,(+)     (-) substance abuse  ,   Endo/Other  diabetes, Poorly Controlled, Type 2, Insulin Dependent  Renal/GU negative Renal ROS     Musculoskeletal   Abdominal   Peds  Hematology   Anesthesia Other Findings Past Medical History: No date: Anemia No date: Diabetes mellitus without complication (HCC) No date: Hypertension  Reproductive/Obstetrics                             Anesthesia Physical Anesthesia Plan  ASA: 3  Anesthesia Plan: General   Post-op Pain Management: Tylenol PO (pre-op), Gabapentin PO (pre-op) and Toradol IV (intra-op)   Induction: Intravenous  PONV Risk Score and Plan: 4 or greater and Ondansetron, Dexamethasone and Midazolam  Airway Management Planned: Oral ETT  Additional Equipment: None  Intra-op Plan:   Post-operative Plan: Extubation in OR  Informed Consent: I have reviewed the patients History and Physical, chart, labs and discussed the procedure including the risks, benefits and alternatives for the proposed anesthesia with the patient or  authorized representative who has indicated his/her understanding and acceptance.     Dental advisory given  Plan Discussed with: CRNA and Surgeon  Anesthesia Plan Comments: (Discussed risks of anesthesia with patient, including PONV, sore throat, lip/dental/eye damage. Rare risks discussed as well, such as cardiorespiratory and neurological sequelae, and allergic reactions. Discussed the role of CRNA in patient's perioperative care. Patient understands.)        Anesthesia Quick Evaluation

## 2021-01-21 NOTE — Interval H&P Note (Signed)
History and Physical Interval Note:  01/21/2021 7:09 AM  Ian Mccormick  has presented today for surgery, with the diagnosis of biliary dyskinesia.  The various methods of treatment have been discussed with the patient and family. After consideration of risks, benefits and other options for treatment, the patient has consented to  Procedure(s): XI ROBOTIC ASSISTED LAPAROSCOPIC CHOLECYSTECTOMY (N/A) [place] (ICG) (N/A) as a surgical intervention.  The patient's history has been reviewed, patient examined, no change in status, stable for surgery.  I have reviewed the patient's chart and labs.  Questions were answered to the patient's satisfaction.     Raiford Juvia Aerts

## 2021-01-21 NOTE — Anesthesia Postprocedure Evaluation (Signed)
Anesthesia Post Note  Patient: Ian Mccormick  Procedure(s) Performed: XI ROBOTIC ASSISTED LAPAROSCOPIC CHOLECYSTECTOMY INDOCYANINE GREEN FLUORESCENCE IMAGING (ICG)  Patient location during evaluation: PACU Anesthesia Type: General Level of consciousness: awake and alert Pain management: pain level controlled Vital Signs Assessment: post-procedure vital signs reviewed and stable Respiratory status: spontaneous breathing, nonlabored ventilation, respiratory function stable and patient connected to nasal cannula oxygen Cardiovascular status: blood pressure returned to baseline and stable Postop Assessment: no apparent nausea or vomiting Anesthetic complications: no   No notable events documented.   Last Vitals:  Vitals:   01/21/21 0945 01/21/21 1016  BP: 122/70 (!) 145/77  Pulse: 61 (!) 58  Resp: (!) 2 16  Temp:  (!) 36.1 C  SpO2: 99% 100%    Last Pain:  Vitals:   01/21/21 1016  TempSrc: Temporal  PainSc: 2                  Arita Miss

## 2021-01-21 NOTE — Op Note (Signed)
°  Procedure Date:  01/21/2021  Pre-operative Diagnosis:  Biliary dyskinesia  Post-operative Diagnosis: Biliary dyskinesia  Procedure:  Robotic assisted cholecystectomy with ICG FireFly cholangiogram  Surgeon:  Melvyn Neth, MD  Anesthesia:  General endotracheal  Estimated Blood Loss:  5 ml  Specimens:  gallbladder  Complications:  None  Indications for Procedure:  This is a 60 y.o. male who presents with abdominal pain and workup revealing biliary dyskinesia.  The benefits, complications, treatment options, and expected outcomes were discussed with the patient. The risks of bleeding, infection, recurrence of symptoms, failure to resolve symptoms, bile duct damage, bile duct leak, retained common bile duct stone, bowel injury, and need for further procedures were all discussed with the patient and he was willing to proceed.  Description of Procedure: The patient was correctly identified in the preoperative area and brought into the operating room.  The patient was placed supine with VTE prophylaxis in place.  Appropriate time-outs were performed.  Anesthesia was induced and the patient was intubated.  Appropriate antibiotics were infused.  The abdomen was prepped and draped in a sterile fashion. An infraumbilical incision was made. A cutdown technique was used to enter the abdominal cavity without injury, and a 12 mm robotic port was inserted.  Pneumoperitoneum was obtained with appropriate opening pressures.  Three 8-mm ports were placed in the mid abdomen at the level of the umbilicus under direct visualization.  The DaVinci platform was docked, camera targeted, and instruments were placed under direct visualization.  The gallbladder was identified.  The fundus was grasped and retracted cephalad.  Adhesions were lysed bluntly and with electrocautery. The infundibulum was grasped and retracted laterally, exposing the peritoneum overlying the gallbladder.  This was incised with  electrocautery and extended on either side of the gallbladder.  FireFly cholangiogram was then obtained, and we were able to clearly identify the cystic duct and common bile duct.  The cystic duct and cystic artery were carefully dissected with combination of cautery and blunt dissection.  Both were clipped twice proximally and once distally, cutting in between.  The gallbladder was taken from the gallbladder fossa in a retrograde fashion with electrocautery. The gallbladder was placed in an Endocatch bag. The liver bed was inspected and any bleeding was controlled with electrocautery. The right upper quadrant was then inspected again revealing intact clips, no bleeding, and no ductal injury.    The 8 mm ports were removed under direct visualization and the 12 mm port was removed.  The Endocatch bag was brought out via the umbilical incision. The fascial opening was closed using 0 vicryl suture.  Local anesthetic was infused in all incisions and the incisions were closed with 4-0 Monocryl.  The wounds were cleaned and sealed with DermaBond.  The patient was emerged from anesthesia and extubated and brought to the recovery room for further management.  The patient tolerated the procedure well and all counts were correct at the end of the case.   Melvyn Neth, MD

## 2021-01-22 LAB — SURGICAL PATHOLOGY

## 2021-02-08 ENCOUNTER — Encounter: Payer: BC Managed Care – PPO | Admitting: Physician Assistant

## 2021-02-09 ENCOUNTER — Encounter: Payer: Self-pay | Admitting: Physician Assistant

## 2021-02-09 ENCOUNTER — Ambulatory Visit (INDEPENDENT_AMBULATORY_CARE_PROVIDER_SITE_OTHER): Payer: BC Managed Care – PPO | Admitting: Physician Assistant

## 2021-02-09 ENCOUNTER — Other Ambulatory Visit: Payer: Self-pay

## 2021-02-09 VITALS — BP 170/80 | HR 62 | Temp 98.0°F | Ht 65.0 in | Wt 177.0 lb

## 2021-02-09 DIAGNOSIS — Z09 Encounter for follow-up examination after completed treatment for conditions other than malignant neoplasm: Secondary | ICD-10-CM

## 2021-02-09 DIAGNOSIS — K828 Other specified diseases of gallbladder: Secondary | ICD-10-CM

## 2021-02-09 NOTE — Progress Notes (Signed)
[facility] POST-OP OFFICE VISIT  02/09/2021  HPI: Ian Mccormick is a 61 y.o. male 19 days s/p robotic assisted laparoscopic cholecystectomy for biliary dyskinesia with Dr Hampton Abbot.   He is doing very well. Some mild soreness with movement but not needing any pain medications No fever, chills, nausea, emesis, or diarrhea He is having some constipation but taking a stool softener No issues with incisions No other complaints   Vital signs: BP (!) 170/80    Pulse 62    Temp 98 F (36.7 C)    Ht 5\' 5"  (1.651 m)    Wt 177 lb (80.3 kg)    SpO2 97%    BMI 29.45 kg/m    Physical Exam: Constitutional: Well appearing male, NAD Abdomen: Soft, non-tender, non-distended, no rebound/guarding Skin: Laparoscopic incisions are healing well, no erythema  Assessment/Plan: This is a 61 y.o. male 19 days s/p robotic assisted laparoscopic cholecystectomy for biliary dyskinesia   - Pain control prn  - Reviewed local wound care  - Reviewed lifting restrictions; 4 weeks total; work note given   - Reviewed surgical pathology: [place]; negative for malignancy  - He can rtc on as needed basis; He understands to call with questions/concerns   -- Edison Simon, PA-C [facility] 02/09/2021, 2:23 PM [phone] M-F: 7am - 4pm

## 2021-02-09 NOTE — Patient Instructions (Addendum)
trate de agregar ms fibra a su dieta. puede complementar con fibra en polvo o gomitas de fibra. Esto ayudar con su estreimiento. llmenos con cualquier pregunta.  Colecistectoma mnimamente invasiva, cuidados posteriores Minimally Invasive Cholecystectomy, Care After Qu puedo esperar despus del procedimiento? Despus del procedimiento, es comn [employer] siguientes sntomas: Education officer, environmental en las zonas de la [place]. Le darn medicamentos para Conservation officer, historic buildings. Vomitar o tener nuseas. Sentir el vientre lleno (meteorismo) o Patent attorney en el hombro. Esto se debe al gas que se [place] durante la [place]. Siga estas instrucciones en su casa: Medicamentos Use los medicamentos de venta libre y los recetados solamente como se lo haya indicado el mdico. Si le recetaron un antibitico, tmelo como se lo haya indicado el mdico. No deje de tomarlo aunque comience a sentirse mejor. Si se lo indican, tome medidas a fin de prevenir problemas para ir de cuerpo (estreimiento). Es posible que deba hacer lo siguiente: Electronics engineer suficiente lquido para Contractor pis (la orina) de color amarillo plido. Tomar medicamentos. Le dirn qu medicamentos debe tomar. Comer alimentos ricos en fibra. Entre ellos, frijoles, cereales integrales y frutas y verduras frescas. Limitar los alimentos con alto contenido de grasa y Location manager. Estos incluyen alimentos fritos o dulces. Pregunte al mdico si debe evitar conducir o Risk manager mquinas mientras toma los medicamentos. Cuidado de la incisin  Siga las instrucciones del mdico en lo que respecta al cuidado de los cortes de la ciruga (incisiones). Asegrese de hacer lo siguiente: Lvese las manos con agua y jabn durante al menos 20 segundos antes y despus de cambiarse la venda (vendaje). Use un desinfectante para manos si no dispone de [place] y [place]. Cmbiese la venda. Deje los puntos (suturas) o la goma para cerrar la piel en su lugar durante al menos 2 semanas. Deje colocadas  las tiras de [place] a menos que se le indique que se las quite. Puede recortar los bordes de las tiras de cinta si se enrollan. No tome baos de inmersin, no practique natacin ni utilice el jacuzzi. Pregunte a su mdico si puede ducharse o darse baos de esponja. Controle la zona de la incisin todos los das para detectar signos de infeccin. Est atento a los siguientes signos: Aumento del enrojecimiento, la hinchazn o Conservation officer, historic buildings. Lquido o sangre. Calor. Pus o mal olor. Actividad Haga reposo como se lo haya indicado el mdico. No realice actividades que requieran mucho esfuerzo. Levntese y camine un poco cada 1 a 2 horas. Pida ayuda si se siente dbil o inestable. No levante objetos que pesen ms de 10 libras (4.5 kg) o que sean ms pesados de lo que le indicaron. No practique deportes de contacto hasta que el mdico lo autorice. No retome el trabajo ni el estudio hasta que el mdico lo autorice. Retome sus actividades normales cuando el mdico le diga que es seguro. Instrucciones generales Si le administraron un sedante durante el procedimiento, no conduzca ni use mquinas hasta que el mdico le indique que es seguro [place]. Un sedante es un medicamento que ayuda a [place]. Concurra a [place]. Comunquese con un mdico si: Aparece una erupcin cutnea. Aumentan el enrojecimiento, la hinchazn o el dolor alrededor de las incisiones. [place] incisiones. Las incisiones estn calientes al tacto. Tiene pus o percibe que sale mal olor del lugar de las incisiones. Tiene fiebre. Una o ms de las incisiones se abren. Solicite ayuda de inmediato si: Tiene dificultad para respirar.  Siente dolor en el pecho. Siente dolor que empeora en la zona de los hombros. Se desmaya o se siente mareado al ponerse de pie. Tiene dolor muy intenso en el vientre (abdomen). Siente que va a vomitar o vomita y esto dura ms de Optician, dispensing. Siente dolor en  la pierna. Estos sntomas pueden Sales executive. Solicite ayuda de inmediato. Llame al 911. No espere a ver si los sntomas desaparecen. No conduzca por sus propios medios [employer] Financial. Resumen Despus de la [place], es comn sentir dolor en las zonas de la [place]. Tambin puede tener vmitos o sentir llenura en el vientre. Siga las instrucciones del mdico acerca de los medicamentos, la restriccin de actividades y el cuidado de las zonas de la [place]. No realice actividades que requieran mucho esfuerzo. Comunquese con el mdico si tiene fiebre u otros signos de infeccin, como ms enrojecimiento, hinchazn o dolor alrededor [employer]. [place] ayuda de inmediato si tiene dolor de pecho, Social research officer, government en los hombros que va en aumento o problemas para Ambulance person. Esta informacin no tiene Marine scientist el consejo del mdico. Asegrese de hacerle al mdico cualquier pregunta que tenga. Document Revised: 08/12/2020 Document Reviewed: 08/12/2020 Elsevier Patient Education  [place].

## 2021-03-16 ENCOUNTER — Other Ambulatory Visit: Payer: Self-pay

## 2021-03-16 ENCOUNTER — Encounter: Payer: Self-pay | Admitting: Gastroenterology

## 2021-03-16 ENCOUNTER — Ambulatory Visit (INDEPENDENT_AMBULATORY_CARE_PROVIDER_SITE_OTHER): Payer: BC Managed Care – PPO | Admitting: Gastroenterology

## 2021-03-16 VITALS — BP 161/93 | HR 69 | Temp 99.1°F | Wt 177.2 lb

## 2021-03-16 DIAGNOSIS — R1011 Right upper quadrant pain: Secondary | ICD-10-CM

## 2021-03-16 DIAGNOSIS — Z8619 Personal history of other infectious and parasitic diseases: Secondary | ICD-10-CM | POA: Diagnosis not present

## 2021-03-16 DIAGNOSIS — K31A11 Gastric intestinal metaplasia without dysplasia, involving the antrum: Secondary | ICD-10-CM

## 2021-03-16 DIAGNOSIS — D509 Iron deficiency anemia, unspecified: Secondary | ICD-10-CM

## 2021-03-16 NOTE — Patient Instructions (Addendum)
Por favor tomese 17 gramos de Miralax diario. No se necesita receta. Puede ir a su farmacia y comprarlo.    Polyethylene Glycol Powder Qu es este medicamento? El POLIETILENGLICOL previene y trata el estreimiento ocasional. Acta ablandando las heces y facilitando la evacuacin de los intestinos. Pertenece a un grupo de medicamentos llamados laxantes. Este medicamento puede ser utilizado para otros usos; si tiene alguna pregunta consulte con su proveedor de atencin mdica o con su farmacutico. MARCAS COMUNES: GaviLax, GIALAX, GlycoLax, Healthylax, MiraLax, Smooth LAX, Vita Health Qu le debo informar a mi profesional de la salud antes de tomar este medicamento? Necesitan saber si usted presenta alguno de los siguientes problemas o situaciones: Antecedentes de bloqueo en los intestinos Nuseas Fenilcetonuria Problema estomacal o intestinal Dolor estomacal Cambio repentino en el hbito intestinal que dura ms de 2 semanas Vmito Una reaccin alrgica o inusual al polietilenglicol (PEG), a otros medicamentos, alimentos, colorantes o conservantes Si est embarazada o buscando quedar embarazada Si est amamantando a un beb Cmo debo utilizar este medicamento? Tome este medicamento por va oral. selo segn las instrucciones en la etiqueta. Agregue la dosis [place] a 4 a 8 onzas o 120 a 240 ml de agua, jugo, gaseosa, caf o t. No mezcle este medicamento con alimentos o con otros lquidos. No combine con espesantes a base de almidn (p. ej., harina, almidn de maz, arruruz, tapioca, goma xntica). Mezcle bien. Beba la solucin. No lo use con una frecuencia mayor a la indicada. Hable con su equipo de atencin sobre el uso de este medicamento en nios. Aunque se Administrator, sports a nios tan pequeos como de 16 aos de edad con ciertas afecciones, existen precauciones que deben tomarse. Sobredosis: Pngase en contacto inmediatamente con un centro toxicolgico o una sala de urgencia si usted  cree que haya tomado demasiado medicamento. ATENCIN: [employer] es solo para usted. No comparta este medicamento con nadie. Qu sucede si me olvido de una dosis? Si olvida una dosis, adminstrela lo antes posible. Si es casi la hora de la prxima dosis, administre solo esa dosis. No se administre dosis adicionales o dobles. Qu puede interactuar con este medicamento? No se anticipan interacciones. Puede ser que esta lista no menciona todas las posibles interacciones. Informe a su profesional de [employer]	[place] de [employer] Corporation productos a base de hierbas, medicamentos de [place] o suplementos nutritivos que est tomando. Si usted fuma, consume bebidas alcohlicas o si utiliza drogas ilegales, indqueselo tambin a su profesional de [employer] Home	[place]. Algunas sustancias pueden interactuar con su medicamento. A qu debo estar atento al usar Coca-Cola? No lo use por ms de una semana sin el consejo de su equipo de atencin. Si vuelve a tener estreimiento, consulte con su equipo de atencin. Beba abundante cantidad de agua mientras [place] este medicamento. Beber agua ayuda a disminuir el estreimiento. Deje de usar Coca-Cola y contacte a su equipo de atencin si tiene sangrado rectal o no tiene una evacuacin despus de usarlo. Estos pueden ser signos de una afeccin mdica ms grave. Qu efectos secundarios puedo tener al [employer] Corporation este medicamento? Efectos secundarios que debe informar a su equipo de atencin tan pronto como sea posible: Reacciones alrgicas: erupcin cutnea, comezn/picazn, urticaria, hinchazn de la cara, los labios, la lengua o la garganta Efectos secundarios que generalmente no requieren atencin mdica (debe informarlos a su equipo de atencin si persisten o si son molestos): Distensin abdominal Gases Nuseas [place] estomacales Puede ser que esta lista no menciona todos los posibles efectos secundarios.  Comunquese a su mdico por asesoramiento mdico [employer]ands. Usted puede informar los efectos secundarios a la FDA por telfono al 1-800-FDA-1088. Dnde debo guardar mi medicina? Mantenga fuera del alcance de nios y Copy. Guarde a [employer], entre 20 y 76 grados Celsius (30 y 76 grados Fahrenheit). Deseche todo el medicamento que no haya utilizado despus de la fecha de vencimiento. Para desechar los medicamentos que ya no necesite o que estn vencidos: Lleve el medicamento a un programa de recuperacin de medicamentos. Consulte con su farmacia o con una entidad reguladora para encontrar un lugar donde llevarlo. Si no puede devolver el medicamento, consulte la etiqueta o el folleto de informacin para ver si debe desecharlo en la basura o arrojarlo por el sanitario. Si no est seguro, consulte con su equipo de atencin. Si es seguro colocarlo en la basura, saque el medicamento del recipiente. Mezcle el medicamento con piedras sanitarias para gatos, tierra, posos (residuos) de caf u otro desperdicio. Coloque la [employer] bolsa o recipiente que quede [place]. Deseche en la basura. ATENCIN: Este folleto es un resumen. Puede ser que no cubra toda la posible informacin. Si usted tiene preguntas acerca de esta medicina, consulte con su mdico, su farmacutico o su profesional de Technical sales engineer.  2022 Elsevier/Gold Standard (2020-08-28 00:00:00)

## 2021-03-16 NOTE — Progress Notes (Signed)
Jonathon Bellows MD, MRCP(U.K) [address]  [place]  [place], [place] [address]  Main: [phone]  Fax: [phone]   Primary Care Physician: Frazier Richards, MD  Primary Gastroenterologist:  Dr. Jonathon Bellows   Chief Complaint  Patient presents with   Biliary Dyskinesia    HPI: Ian Mccormick is a 61 y.o. male  Summary of history :   He was initially referred and seen on 10/14/2020 for right upper quadrant pain going on 1 to 2 times per week.  History of recent COVID.  On IV iron for iron deficiency managed by hematology.  Also had B12 deficiency at the same time.  No overt blood loss.   11/09/2020: EGD: Normal, colonoscopy: 5 polyps resected in the ascending colon and cecum 4 to 6 mm in size.  Biopsies of the stomach showed active gastritis with H. pylori and intestinal metaplasia.  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  Commenced on triple therapy for H. pylori with clarithromycin based therapy.  10/14/2020 celiac serology, antiparietal cell antibody, intrinsic factor antibody negative. 11/03/2020 HIDA scan showed decreased ejection fraction of 30%.    Interval history   12/15/2020-03/16/2021   12/15/2020: H pylori breath test negative 01/21/2021: Cholecystectomy: path shows chronic cholecystitis.  02/19/2021: Capsule study of small bowel : normal   Since his last visit he has had significant improvement in the pain which she had in the right side of his abdomen.  Not much symptoms of heartburn.  Some constipation for which he is taking medications.      Current Outpatient Medications  Medication Sig Dispense Refill   acetaminophen (TYLENOL) 500 MG tablet Take 2 tablets (1,000 mg total) by mouth every 6 (six) hours as needed for mild pain.     amLODipine (NORVASC) 10 MG tablet Take 10 mg by mouth daily.     aspirin EC 81 MG tablet Take 81 mg by mouth daily. Swallow whole.     Blood Glucose Monitoring Suppl (TRUE METRIX AIR GLUCOSE METER) DEVI  SMARTSIG:1 Via Meter Daily     docusate sodium (COLACE) 100 MG capsule Take 100 mg by mouth 2 (two) times daily.     hydrochlorothiazide (HYDRODIURIL) 25 MG tablet Take 25 mg by mouth daily.     insulin aspart protamine- aspart (NOVOLOG MIX 70/30) (70-30) 100 UNIT/ML injection Inject 40 Units into the skin 2 (two) times daily with a meal.     Insulin Pen Needle (PEN NEEDLES) 31G X 5 MM MISC PARA USAR CON INYECCION DE INSULINA 4 VECES AL DIA.     JARDIANCE 25 MG TABS tablet Take 25 mg by mouth daily.     losartan (COZAAR) 100 MG tablet Take 100 mg by mouth daily.     metFORMIN (GLUCOPHAGE) 1000 MG tablet Take 1,000 mg by mouth 2 (two) times daily.     metoprolol (TOPROL-XL) 200 MG 24 hr tablet Take 200 mg by mouth daily.     omeprazole (PRILOSEC) 40 MG capsule Take 1 capsule (40 mg total) by mouth daily. 90 capsule 0   rosuvastatin (CRESTOR) 20 MG tablet Take 20 mg by mouth daily.     TRUEplus Lancets 28G MISC SMARTSIG:1 Topical Daily PRN     No current facility-administered medications for this visit.    Allergies as of 03/16/2021   (No Known Allergies)    ROS:  General: Negative for anorexia, weight loss, fever, chills, fatigue, weakness. ENT: Negative for hoarseness, difficulty swallowing , nasal congestion. CV:  Negative for chest pain, angina, palpitations, dyspnea on exertion, peripheral edema.  Respiratory: Negative for dyspnea at rest, dyspnea on exertion, cough, sputum, wheezing.  GI: See history of present illness. GU:  Negative for dysuria, hematuria, urinary incontinence, urinary frequency, nocturnal urination.  Endo: Negative for unusual weight change.    Physical Examination:   BP (!) 161/93    Pulse 69    Temp 99.1 F (37.3 C) (Oral)    Wt 177 lb 3.2 oz (80.4 kg)    BMI 29.49 kg/m   General: Well-nourished, well-developed in no acute distress.  Eyes: No icterus. Conjunctivae pink. Neuro: Alert and oriented x 3.  Grossly intact. Skin: Warm and dry, no jaundice.    Psych: Alert and cooperative, normal mood and affect.   Imaging Studies: No results found.  Assessment and Plan:   Ian Mccormick is a 61 y.o. y/o male here to follow-up for right upper quadrant pain and iron deficiency anemia.  Also of B12 recently.  EGD showed H. pylori gastritis and intestinal metaplasia.  Colonoscopy showed a few subcentimeter polyps resected which were adenomas and sessile serrated adenomas.  New onset constipation.  Status post treatment with clarithromycin-based treatment for H. pylori.  HIDA scan showed decreased ejection fraction of 30% referred to Dr. Hampton Abbot and had a cholecystectomy since then has had resolution of the pain..   Plan  B12 replacement to be continued Complete course of omeprazole and stop   Dr Jonathon Bellows  MD,MRCP [facility]) Follow up in as needed

## 2021-03-17 ENCOUNTER — Other Ambulatory Visit: Payer: Self-pay | Admitting: *Deleted

## 2021-03-17 DIAGNOSIS — D509 Iron deficiency anemia, unspecified: Secondary | ICD-10-CM

## 2021-03-23 ENCOUNTER — Other Ambulatory Visit: Payer: Self-pay

## 2021-03-23 ENCOUNTER — Inpatient Hospital Stay: Payer: BC Managed Care – PPO | Attending: Oncology

## 2021-03-23 DIAGNOSIS — E1122 Type 2 diabetes mellitus with diabetic chronic kidney disease: Secondary | ICD-10-CM | POA: Diagnosis not present

## 2021-03-23 DIAGNOSIS — D472 Monoclonal gammopathy: Secondary | ICD-10-CM | POA: Insufficient documentation

## 2021-03-23 DIAGNOSIS — R0789 Other chest pain: Secondary | ICD-10-CM | POA: Diagnosis not present

## 2021-03-23 DIAGNOSIS — I129 Hypertensive chronic kidney disease with stage 1 through stage 4 chronic kidney disease, or unspecified chronic kidney disease: Secondary | ICD-10-CM | POA: Insufficient documentation

## 2021-03-23 DIAGNOSIS — Z87891 Personal history of nicotine dependence: Secondary | ICD-10-CM | POA: Diagnosis not present

## 2021-03-23 DIAGNOSIS — N189 Chronic kidney disease, unspecified: Secondary | ICD-10-CM | POA: Insufficient documentation

## 2021-03-23 DIAGNOSIS — D509 Iron deficiency anemia, unspecified: Secondary | ICD-10-CM

## 2021-03-23 LAB — COMPREHENSIVE METABOLIC PANEL
ALT: 34 U/L (ref 0–44)
AST: 30 U/L (ref 15–41)
Albumin: 4 g/dL (ref 3.5–5.0)
Alkaline Phosphatase: 89 U/L (ref 38–126)
Anion gap: 9 (ref 5–15)
BUN: 31 mg/dL — ABNORMAL HIGH (ref 8–23)
CO2: 27 mmol/L (ref 22–32)
Calcium: 9.7 mg/dL (ref 8.9–10.3)
Chloride: 100 mmol/L (ref 98–111)
Creatinine, Ser: 1.58 mg/dL — ABNORMAL HIGH (ref 0.61–1.24)
GFR, Estimated: 49 mL/min — ABNORMAL LOW (ref >60)
Glucose, Bld: 184 mg/dL — ABNORMAL HIGH (ref 70–99)
Potassium: 4.1 mmol/L (ref 3.5–5.1)
Sodium: 136 mmol/L (ref 135–145)
Total Bilirubin: 0.3 mg/dL (ref 0.3–1.2)
Total Protein: 7.4 g/dL (ref 6.5–8.1)

## 2021-03-23 LAB — CBC WITH DIFFERENTIAL/PLATELET
Abs Immature Granulocytes: 0.05 10*3/uL (ref 0.00–0.07)
Basophils Absolute: 0.1 10*3/uL (ref 0.0–0.1)
Basophils Relative: 1 %
Eosinophils Absolute: 1 10*3/uL — ABNORMAL HIGH (ref 0.0–0.5)
Eosinophils Relative: 11 %
HCT: 41.9 % (ref 39.0–52.0)
Hemoglobin: 14.1 g/dL (ref 13.0–17.0)
Immature Granulocytes: 1 %
Lymphocytes Relative: 21 %
Lymphs Abs: 2 10*3/uL (ref 0.7–4.0)
MCH: 27.6 pg (ref 26.0–34.0)
MCHC: 33.7 g/dL (ref 30.0–36.0)
MCV: 82.2 fL (ref 80.0–100.0)
Monocytes Absolute: 0.5 10*3/uL (ref 0.1–1.0)
Monocytes Relative: 5 %
Neutro Abs: 5.9 10*3/uL (ref 1.7–7.7)
Neutrophils Relative %: 61 %
Platelets: 170 10*3/uL (ref 150–400)
RBC: 5.1 MIL/uL (ref 4.22–5.81)
RDW: 13.5 % (ref 11.5–15.5)
WBC: 9.5 10*3/uL (ref 4.0–10.5)
nRBC: 0 % (ref 0.0–0.2)

## 2021-03-23 LAB — FERRITIN: Ferritin: 80 ng/mL (ref 24–336)

## 2021-03-23 LAB — VITAMIN B12: Vitamin B-12: 389 pg/mL (ref 180–914)

## 2021-03-23 LAB — IRON AND TIBC
Iron: 114 ug/dL (ref 45–182)
Saturation Ratios: 34 % (ref 17.9–39.5)
TIBC: 333 ug/dL (ref 250–450)
UIBC: 219 ug/dL

## 2021-03-24 LAB — KAPPA/LAMBDA LIGHT CHAINS
Kappa free light chain: 81.7 mg/L — ABNORMAL HIGH (ref 3.3–19.4)
Kappa, lambda light chain ratio: 1.48 (ref 0.26–1.65)
Lambda free light chains: 55.1 mg/L — ABNORMAL HIGH (ref 5.7–26.3)

## 2021-03-26 LAB — MULTIPLE MYELOMA PANEL, SERUM
Albumin SerPl Elph-Mcnc: 3.7 g/dL (ref 2.9–4.4)
Albumin/Glob SerPl: 1.2 (ref 0.7–1.7)
Alpha 1: 0.2 g/dL (ref 0.0–0.4)
Alpha2 Glob SerPl Elph-Mcnc: 1 g/dL (ref 0.4–1.0)
B-Globulin SerPl Elph-Mcnc: 1.3 g/dL (ref 0.7–1.3)
Gamma Glob SerPl Elph-Mcnc: 0.7 g/dL (ref 0.4–1.8)
Globulin, Total: 3.1 g/dL (ref 2.2–3.9)
IgA: 630 mg/dL — ABNORMAL HIGH (ref 61–437)
IgG (Immunoglobin G), Serum: 721 mg/dL (ref 603–1613)
IgM (Immunoglobulin M), Srm: 68 mg/dL (ref 20–172)
M Protein SerPl Elph-Mcnc: 0.7 g/dL — ABNORMAL HIGH
Total Protein ELP: 6.8 g/dL (ref 6.0–8.5)

## 2021-03-30 ENCOUNTER — Inpatient Hospital Stay (HOSPITAL_BASED_OUTPATIENT_CLINIC_OR_DEPARTMENT_OTHER): Payer: BC Managed Care – PPO | Admitting: Oncology

## 2021-03-30 ENCOUNTER — Encounter: Payer: Self-pay | Admitting: Oncology

## 2021-03-30 ENCOUNTER — Other Ambulatory Visit: Payer: Self-pay

## 2021-03-30 VITALS — BP 126/78 | HR 65 | Temp 98.6°F | Resp 20 | Wt 181.6 lb

## 2021-03-30 DIAGNOSIS — D509 Iron deficiency anemia, unspecified: Secondary | ICD-10-CM

## 2021-03-30 DIAGNOSIS — D472 Monoclonal gammopathy: Secondary | ICD-10-CM

## 2021-03-30 NOTE — Addendum Note (Signed)
Addended by: Luella Cook on: 03/30/2021 08:21 PM   Modules accepted: Orders

## 2021-03-30 NOTE — Progress Notes (Signed)
Patient states he has having pain in chest area on the left side for the last 20-25 days. Patient states he has pain in lower back area for the last week.

## 2021-03-30 NOTE — Progress Notes (Signed)
° ° ° °Hematology/Oncology Consult note °[facility]  °[phone] [phone] ° °Patient Care Team: °Adamo, Elena M, MD as PCP - General (Family Medicine)  ° °Name of the patient: Ian Mccormick  °[id number]  °01/27/1961  ° °Date of visit: 03/30/21 ° °Diagnosis- -iron deficiency anemia °IgA MGUS ° °Chief complaint/ Reason for visit-routine follow-up of iron deficiency anemia and MGUS ° °Heme/Onc history: Patient is a 60-year-old Hispanic male who was seen by me in July 2022 for evidence of iron deficiency anemia when his hemoglobin of 10.9.  He received IV iron at that time.  Also sent to GI and had an EGD and colonoscopy done.  He was positive for H. pylori and was treated accordingly.  Subsequently anemia resolved.  As a part of his anemia work-up patient also had myeloma panel checked in July 2022 which showed a mildly elevated IgA level of 714And immunofixation showed 0.4 g of IgA kappa monoclonal protein.  Both kappa and lambda free light chains were elevated with a normal ratio of 1.38.  Patient also has an element of CKD and his creatinine has been around 1.4 ° °Interval history-history obtained with the help of Spanish interpreter.  Overall doing wellBut does report sharp stabbing pain in the left side of the chest for the last 3 weeks.  Also reports low back pain that worsens with movement.  Chest wall pain has been intermittent without any obvious triggers such as exertion.  Denies any shortness of breath ° °ECOG PS- 0 °Pain scale- 3 ° ° °Review of systems- Review of Systems  °Constitutional:  Positive for malaise/fatigue. Negative for chills, fever and weight loss.  °HENT:  Negative for congestion, ear discharge and nosebleeds.   °Eyes:  Negative for blurred vision.  °Respiratory:  Negative for cough, hemoptysis, sputum production, shortness of breath and wheezing.   °Cardiovascular:  Positive for chest pain. Negative for palpitations, orthopnea and claudication.   °Gastrointestinal:  Negative for abdominal pain, blood in stool, constipation, diarrhea, heartburn, melena, nausea and vomiting.  °Genitourinary:  Negative for dysuria, flank pain, frequency, hematuria and urgency.  °Musculoskeletal:  Negative for back pain, joint pain and myalgias.  °Skin:  Negative for rash.  °Neurological:  Negative for dizziness, tingling, focal weakness, seizures, weakness and headaches.  °Endo/Heme/Allergies:  Does not bruise/bleed easily.  °Psychiatric/Behavioral:  Negative for depression and suicidal ideas. The patient does not have insomnia.    ° ° ° °No Known Allergies ° ° °Past Medical History:  °Diagnosis Date  ° Anemia   ° Diabetes mellitus without complication (HCC)   ° Hypertension   ° ° ° °Past Surgical History:  °Procedure Laterality Date  ° COLONOSCOPY WITH PROPOFOL N/A 11/09/2020  ° Procedure: COLONOSCOPY WITH PROPOFOL;  Surgeon: Anna, Kiran, MD;  Location: [facility] ENDOSCOPY;  Service: Gastroenterology;  Laterality: N/A;  ° ESOPHAGOGASTRODUODENOSCOPY (EGD) WITH PROPOFOL N/A 11/09/2020  ° Procedure: ESOPHAGOGASTRODUODENOSCOPY (EGD) WITH PROPOFOL;  Surgeon: Anna, Kiran, MD;  Location: [facility] ENDOSCOPY;  Service: Gastroenterology;  Laterality: N/A;  ° EYE SURGERY    ° GIVENS CAPSULE STUDY N/A 01/12/2021  ° Procedure: GIVENS CAPSULE STUDY;  Surgeon: Anna, Kiran, MD;  Location: [facility] ENDOSCOPY;  Service: Gastroenterology;  Laterality: N/A;  ° ° °Social History  ° °Socioeconomic History  ° Marital status: Married  °  Spouse name: Not on file  ° Number of children: Not on file  ° Years of education: Not on file  ° Highest education level: Not on file  °Occupational History  °   Not on file  °Tobacco Use  ° Smoking status: Former  °  Packs/day: 1.00  °  Years: 4.00  °  Pack years: 4.00  °  Types: Cigarettes  °  Quit date: 2000  °  Years since quitting: 23.1  ° Smokeless tobacco: Never  °Vaping Use  ° Vaping Use: Former  °Substance and Sexual Activity  ° Alcohol use: No  ° Drug use: No  ° Sexual  activity: Not on file  °Other Topics Concern  ° Not on file  °Social History Narrative  ° Not on file  ° °Social Determinants of Health  ° °Financial Resource Strain: Not on file  °Food Insecurity: Not on file  °Transportation Needs: Not on file  °Physical Activity: Not on file  °Stress: Not on file  °Social Connections: Not on file  °Intimate Partner Violence: Not on file  ° ° °Family History  °Problem Relation Age of Onset  ° Hypertension Mother   ° Diabetes Mother   ° ° ° °Current Outpatient Medications:  °  acetaminophen (TYLENOL) 500 MG tablet, Take 2 tablets (1,000 mg total) by mouth every 6 (six) hours as needed for mild pain., Disp: , Rfl:  °  amLODipine (NORVASC) 10 MG tablet, Take 10 mg by mouth daily., Disp: , Rfl:  °  aspirin EC 81 MG tablet, Take 81 mg by mouth daily. Swallow whole., Disp: , Rfl:  °  Blood Glucose Monitoring Suppl (TRUE METRIX AIR GLUCOSE METER) DEVI, SMARTSIG:1 Via Meter Daily, Disp: , Rfl:  °  docusate sodium (COLACE) 100 MG capsule, Take 100 mg by mouth 2 (two) times daily., Disp: , Rfl:  °  hydrochlorothiazide (HYDRODIURIL) 25 MG tablet, Take 25 mg by mouth daily., Disp: , Rfl:  °  insulin aspart protamine- aspart (NOVOLOG MIX 70/30) (70-30) 100 UNIT/ML injection, Inject 40 Units into the skin 2 (two) times daily with a meal., Disp: , Rfl:  °  Insulin Pen Needle (PEN NEEDLES) 31G X 5 MM MISC, PARA USAR CON INYECCION DE INSULINA 4 VECES AL DIA., Disp: , Rfl:  °  JARDIANCE 25 MG TABS tablet, Take 25 mg by mouth daily., Disp: , Rfl:  °  losartan (COZAAR) 100 MG tablet, Take 100 mg by mouth daily., Disp: , Rfl:  °  metFORMIN (GLUCOPHAGE) 1000 MG tablet, Take 1,000 mg by mouth 2 (two) times daily., Disp: , Rfl:  °  metoprolol (TOPROL-XL) 200 MG 24 hr tablet, Take 200 mg by mouth daily., Disp: , Rfl:  °  omeprazole (PRILOSEC) 40 MG capsule, Take 1 capsule (40 mg total) by mouth daily., Disp: 90 capsule, Rfl: 0 °  rosuvastatin (CRESTOR) 20 MG tablet, Take 20 mg by mouth daily., Disp: ,  Rfl:  °  TRUEplus Lancets 28G MISC, SMARTSIG:1 Topical Daily PRN, Disp: , Rfl:  ° °Physical exam:  °Vitals:  ° 03/30/21 1108  °BP: 126/78  °Pulse: 65  °Resp: 20  °Temp: 98.6 °F (37 °C)  °SpO2: 100%  °Weight: 181 lb 9.6 oz (82.4 kg)  ° °Physical Exam °Constitutional:   °   General: He is not in acute distress. °Cardiovascular:  °   Rate and Rhythm: Normal rate and regular rhythm.  °   Heart sounds: Normal heart sounds.  °Pulmonary:  °   Effort: Pulmonary effort is normal.  °   Breath sounds: Normal breath sounds.  °Abdominal:  °   General: Bowel sounds are normal.  °   Palpations: Abdomen is soft.  °Skin: °     General: Skin is warm and dry.  Neurological:     Mental Status: He is alert and oriented to person, place, and time.     CMP Latest Ref Rng & Units 03/23/2021  Glucose 70 - 99 mg/dL 184(H)  BUN 8 - 23 mg/dL 31(H)  Creatinine 0.61 - 1.24 mg/dL 1.58(H)  Sodium 135 - 145 mmol/L 136  Potassium 3.5 - 5.1 mmol/L 4.1  Chloride 98 - 111 mmol/L 100  CO2 22 - 32 mmol/L 27  Calcium 8.9 - 10.3 mg/dL 9.7  Total Protein 6.5 - 8.1 g/dL 7.4  Total Bilirubin 0.3 - 1.2 mg/dL 0.3  Alkaline Phos 38 - 126 U/L 89  AST 15 - 41 U/L 30  ALT 0 - 44 U/L 34   CBC Latest Ref Rng & Units 03/23/2021  WBC 4.0 - 10.5 K/uL 9.5  Hemoglobin 13.0 - 17.0 g/dL 14.1  Hematocrit 39.0 - 52.0 % 41.9  Platelets 150 - 400 K/uL 170     Assessment and plan- Patient is a 61 y.o. male who is here for follow-up of following issues:  Iron deficiency anemia: Secondary to H. pylori infection which is now resolved.  Hemoglobin is stable at 14 and iron studies are normal.  We will repeat ferritin and iron studies and see him in 6 months.  IgA MGUS: M protein is slightly higher at 0.7 as compared to 2.4 before.Both kappa and lambda free light chains are elevated with a normal free light chain ratio likely secondary to CKD.  I am holding off on bone marrow biopsy at this time but if M protein keeps going up I will consider doing a bone  marrow biopsy in the future.  We will repeat myeloma panel and serum free light chains again in 3 and 6 months.  Chest wall pain: Symptoms seem musculoskeletal.  I however strongly recommend that he should talk to Dr. Sherril Cong about the symptoms to rule out any cardiac causes   Visit Diagnosis 1. Iron deficiency anemia, unspecified iron deficiency anemia type   2. MGUS (monoclonal gammopathy of unknown significance)      Dr. Randa Evens, MD, MPH [facility] [phone] 03/30/2021 4:14 PM

## 2021-06-28 ENCOUNTER — Inpatient Hospital Stay: Payer: BC Managed Care – PPO | Attending: Oncology

## 2021-10-06 ENCOUNTER — Other Ambulatory Visit: Payer: Self-pay

## 2021-10-06 ENCOUNTER — Inpatient Hospital Stay: Payer: BC Managed Care – PPO | Attending: Oncology

## 2021-10-06 ENCOUNTER — Inpatient Hospital Stay (HOSPITAL_BASED_OUTPATIENT_CLINIC_OR_DEPARTMENT_OTHER): Payer: BC Managed Care – PPO | Admitting: Oncology

## 2021-10-06 ENCOUNTER — Encounter: Payer: Self-pay | Admitting: Oncology

## 2021-10-06 VITALS — Resp 18 | Wt 186.5 lb

## 2021-10-06 DIAGNOSIS — E1122 Type 2 diabetes mellitus with diabetic chronic kidney disease: Secondary | ICD-10-CM | POA: Diagnosis not present

## 2021-10-06 DIAGNOSIS — D472 Monoclonal gammopathy: Secondary | ICD-10-CM | POA: Diagnosis not present

## 2021-10-06 DIAGNOSIS — D509 Iron deficiency anemia, unspecified: Secondary | ICD-10-CM

## 2021-10-06 DIAGNOSIS — Z87891 Personal history of nicotine dependence: Secondary | ICD-10-CM | POA: Diagnosis not present

## 2021-10-06 DIAGNOSIS — I129 Hypertensive chronic kidney disease with stage 1 through stage 4 chronic kidney disease, or unspecified chronic kidney disease: Secondary | ICD-10-CM | POA: Insufficient documentation

## 2021-10-06 DIAGNOSIS — N189 Chronic kidney disease, unspecified: Secondary | ICD-10-CM | POA: Diagnosis not present

## 2021-10-06 LAB — COMPREHENSIVE METABOLIC PANEL
ALT: 30 U/L (ref 0–44)
AST: 28 U/L (ref 15–41)
Albumin: 3.9 g/dL (ref 3.5–5.0)
Alkaline Phosphatase: 85 U/L (ref 38–126)
Anion gap: 8 (ref 5–15)
BUN: 36 mg/dL — ABNORMAL HIGH (ref 8–23)
CO2: 25 mmol/L (ref 22–32)
Calcium: 9.3 mg/dL (ref 8.9–10.3)
Chloride: 100 mmol/L (ref 98–111)
Creatinine, Ser: 1.8 mg/dL — ABNORMAL HIGH (ref 0.61–1.24)
GFR, Estimated: 42 mL/min — ABNORMAL LOW (ref >60)
Glucose, Bld: 183 mg/dL — ABNORMAL HIGH (ref 70–99)
Potassium: 4.3 mmol/L (ref 3.5–5.1)
Sodium: 133 mmol/L — ABNORMAL LOW (ref 135–145)
Total Bilirubin: 0.6 mg/dL (ref 0.3–1.2)
Total Protein: 7 g/dL (ref 6.5–8.1)

## 2021-10-06 LAB — CBC
HCT: 37.6 % — ABNORMAL LOW (ref 39.0–52.0)
Hemoglobin: 13 g/dL (ref 13.0–17.0)
MCH: 27.9 pg (ref 26.0–34.0)
MCHC: 34.6 g/dL (ref 30.0–36.0)
MCV: 80.7 fL (ref 80.0–100.0)
Platelets: 158 10*3/uL (ref 150–400)
RBC: 4.66 MIL/uL (ref 4.22–5.81)
RDW: 13.6 % (ref 11.5–15.5)
WBC: 8.5 10*3/uL (ref 4.0–10.5)
nRBC: 0 % (ref 0.0–0.2)

## 2021-10-06 LAB — FERRITIN: Ferritin: 65 ng/mL (ref 24–336)

## 2021-10-06 LAB — IRON AND TIBC
Iron: 79 ug/dL (ref 45–182)
Saturation Ratios: 25 % (ref 17.9–39.5)
TIBC: 321 ug/dL (ref 250–450)
UIBC: 242 ug/dL

## 2021-10-06 NOTE — Progress Notes (Signed)
Pt states at times he feels very fatigued at times; when bending over and stands up he gets pretty dizzy. Pt had a CT on 8/28 due to blood in urine.

## 2021-10-07 LAB — KAPPA/LAMBDA LIGHT CHAINS
Kappa free light chain: 76.5 mg/L — ABNORMAL HIGH (ref 3.3–19.4)
Kappa, lambda light chain ratio: 1.52 (ref 0.26–1.65)
Lambda free light chains: 50.2 mg/L — ABNORMAL HIGH (ref 5.7–26.3)

## 2021-10-10 ENCOUNTER — Encounter: Payer: Self-pay | Admitting: Oncology

## 2021-10-10 NOTE — Progress Notes (Signed)
Hematology/Oncology Consult note [facility]  [phone] [phone]  Patient Care Team: Frazier Richards, MD as PCP - General (Family Medicine) Sindy Guadeloupe, MD as Consulting Physician (Oncology)   Name of the patient: Ian Mccormick  [id number]  [birth year]   Date of visit: 10/10/21  Diagnosis- iron deficiency anemia IgA MGUS  Chief complaint/ Reason for visit-follow-up of MGUS  Heme/Onc history:  Patient is a 60 year old Hispanic male who was seen by me in July 2022 for evidence of iron deficiency anemia when his hemoglobin of 10.9.  He received IV iron at that time.  Also sent to GI and had an EGD and colonoscopy done.  He was positive for H. pylori and was treated accordingly.  Subsequently anemia resolved.  As a part of his anemia work-up patient also had myeloma panel checked in July 2022 which showed a mildly elevated IgA level of 714And immunofixation showed 0.4 g of IgA kappa monoclonal protein.  Both kappa and lambda free light chains were elevated with a normal ratio of 1.38.  Patient also has an element of CKD and his creatinine has been around 1.4  Interval history- History obtained with the help of Spanish interpreter.  He reports ongoing fatigue and occasionally gets lightheaded when he stands.  Denies any blood loss in his stool.  He had an episode of hematuria about a week ago and was in [facility] ER and underwent CT abdomen and pelvis without contrast which did not show any acute pathology.  ECOG PS- 1 Pain scale- 0   Review of systems- Review of Systems  Constitutional:  Positive for malaise/fatigue. Negative for chills, fever and weight loss.  HENT:  Negative for congestion, ear discharge and nosebleeds.   Eyes:  Negative for blurred vision.  Respiratory:  Negative for cough, hemoptysis, sputum production, shortness of breath and wheezing.   Cardiovascular:  Negative for chest pain, palpitations, orthopnea and claudication.   Gastrointestinal:  Negative for abdominal pain, blood in stool, constipation, diarrhea, heartburn, melena, nausea and vomiting.  Genitourinary:  Negative for dysuria, flank pain, frequency, hematuria and urgency.  Musculoskeletal:  Negative for back pain, joint pain and myalgias.  Skin:  Negative for rash.  Neurological:  Negative for dizziness, tingling, focal weakness, seizures, weakness and headaches.  Endo/Heme/Allergies:  Does not bruise/bleed easily.  Psychiatric/Behavioral:  Negative for depression and suicidal ideas. The patient does not have insomnia.       No Known Allergies   Past Medical History:  Diagnosis Date   Anemia    Diabetes mellitus without complication ([place])    Hypertension      Past Surgical History:  Procedure Laterality Date   COLONOSCOPY WITH PROPOFOL N/A 11/09/2020   Procedure: COLONOSCOPY WITH PROPOFOL;  Surgeon: Jonathon Bellows, MD;  Location: [facility] ENDOSCOPY;  Service: Gastroenterology;  Laterality: N/A;   ESOPHAGOGASTRODUODENOSCOPY (EGD) WITH PROPOFOL N/A 11/09/2020   Procedure: ESOPHAGOGASTRODUODENOSCOPY (EGD) WITH PROPOFOL;  Surgeon: Jonathon Bellows, MD;  Location: [facility] ENDOSCOPY;  Service: Gastroenterology;  Laterality: N/A;   EYE SURGERY     GIVENS CAPSULE STUDY N/A 01/12/2021   Procedure: GIVENS CAPSULE STUDY;  Surgeon: Jonathon Bellows, MD;  Location: [facility] ENDOSCOPY;  Service: Gastroenterology;  Laterality: N/A;    Social History   Socioeconomic History   Marital status: Married    Spouse name: Not on file   Number of children: Not on file   Years of education: Not on file   Highest education level: Not on file  Occupational  History   Not on file  Tobacco Use   Smoking status: Former    Packs/day: 1.00    Years: 4.00    Total pack years: 4.00    Types: Cigarettes    Quit date: 2000    Years since quitting: 23.6   Smokeless tobacco: Never  Vaping Use   Vaping Use: Former  Substance and Sexual Activity   Alcohol use: No   Drug use: No    Sexual activity: Not on file  Other Topics Concern   Not on file  Social History Narrative   Not on file   Social Determinants of Health   Financial Resource Strain: Not on file  Food Insecurity: Not on file  Transportation Needs: Not on file  Physical Activity: Not on file  Stress: Not on file  Social Connections: Not on file  Intimate Partner Violence: Not on file    Family History  Problem Relation Age of Onset   Hypertension Mother    Diabetes Mother      Current Outpatient Medications:    acetaminophen (TYLENOL) 500 MG tablet, Take 2 tablets (1,000 mg total) by mouth every 6 (six) hours as needed for mild pain., Disp: , Rfl:    amLODipine (NORVASC) 10 MG tablet, Take 10 mg by mouth daily., Disp: , Rfl:    aspirin EC 81 MG tablet, Take 81 mg by mouth daily. Swallow whole., Disp: , Rfl:    Blood Glucose Monitoring Suppl (TRUE METRIX AIR GLUCOSE METER) DEVI, SMARTSIG:1 Via Meter Daily, Disp: , Rfl:    docusate sodium (COLACE) 100 MG capsule, Take 100 mg by mouth 2 (two) times daily., Disp: , Rfl:    hydrochlorothiazide (HYDRODIURIL) 25 MG tablet, Take 25 mg by mouth daily., Disp: , Rfl:    HYDROcodone-acetaminophen (NORCO/VICODIN) 5-325 MG tablet, Take 1 tablet by mouth every 6 (six) hours as needed., Disp: , Rfl:    insulin aspart protamine- aspart (NOVOLOG MIX 70/30) (70-30) 100 UNIT/ML injection, Inject 40 Units into the skin 2 (two) times daily with a meal., Disp: , Rfl:    Insulin Pen Needle (PEN NEEDLES) 31G X 5 MM MISC, PARA USAR CON INYECCION DE INSULINA 4 VECES AL DIA., Disp: , Rfl:    JARDIANCE 25 MG TABS tablet, Take 25 mg by mouth daily., Disp: , Rfl:    losartan (COZAAR) 100 MG tablet, Take 100 mg by mouth daily., Disp: , Rfl:    metFORMIN (GLUCOPHAGE) 1000 MG tablet, Take 1,000 mg by mouth 2 (two) times daily., Disp: , Rfl:    metoprolol (TOPROL-XL) 200 MG 24 hr tablet, Take 200 mg by mouth daily., Disp: , Rfl:    omeprazole (PRILOSEC) 40 MG capsule, Take 1  capsule (40 mg total) by mouth daily., Disp: 90 capsule, Rfl: 0   ondansetron (ZOFRAN) 4 MG tablet, Take 4 mg by mouth every 8 (eight) hours as needed., Disp: , Rfl:    OZEMPIC, 0.25 OR 0.5 MG/DOSE, 2 MG/1.5ML SOPN, Inject into the skin., Disp: , Rfl:    rosuvastatin (CRESTOR) 20 MG tablet, Take 20 mg by mouth daily., Disp: , Rfl:    TRUEplus Lancets 28G MISC, SMARTSIG:1 Topical Daily PRN, Disp: , Rfl:    tamsulosin (FLOMAX) 0.4 MG CAPS capsule, Take 0.4 mg by mouth daily. (Patient not taking: Reported on 10/06/2021), Disp: , Rfl:   Physical exam:  Vitals:   10/06/21 1417  Resp: 18  Weight: 186 lb 8 oz (84.6 kg)   Physical Exam Constitutional:  General: He is not in acute distress. Cardiovascular:     Rate and Rhythm: Normal rate and regular rhythm.     Heart sounds: Normal heart sounds.  Pulmonary:     Effort: Pulmonary effort is normal.     Breath sounds: Normal breath sounds.  Abdominal:     General: Bowel sounds are normal.     Palpations: Abdomen is soft.  Skin:    General: Skin is warm and dry.  Neurological:     Mental Status: He is alert and oriented to person, place, and time.         Latest Ref Rng & Units 10/06/2021    1:42 PM  CMP  Glucose 70 - 99 mg/dL 183   BUN 8 - 23 mg/dL 36   Creatinine 0.61 - 1.24 mg/dL 1.80   Sodium 135 - 145 mmol/L 133   Potassium 3.5 - 5.1 mmol/L 4.3   Chloride 98 - 111 mmol/L 100   CO2 22 - 32 mmol/L 25   Calcium 8.9 - 10.3 mg/dL 9.3   Total Protein 6.5 - 8.1 g/dL 7.0   Total Bilirubin 0.3 - 1.2 mg/dL 0.6   Alkaline Phos 38 - 126 U/L 85   AST 15 - 41 U/L 28   ALT 0 - 44 U/L 30       Latest Ref Rng & Units 10/06/2021    1:42 PM  CBC  WBC 4.0 - 10.5 K/uL 8.5   Hemoglobin 13.0 - 17.0 g/dL 13.0   Hematocrit 39.0 - 52.0 % 37.6   Platelets 150 - 400 K/uL 158     Assessment and plan- Patient is a 61 y.o. male is here for follow-up of following issues:   Iron deficiency anemia: Patient is not presently anemic with a  hemoglobin of 13.  Ferritin levels are normal at 65 with an iron saturation of 25%.  He therefore does not require any IV iron at this time.  IgA MGUS: Myeloma panel from today is pending.  His previous M protein from February 2023 showed a slight increase from 0.4 g 2.7 g which I will continue to monitor.  Both kappa and lambda free light chains are elevated but the ratio remains normal likely secondary to his CKD.  I will see him back in 6 months with CBC with differential CMP myeloma panel and serum free light chains   Visit Diagnosis 1. Iron deficiency anemia, unspecified iron deficiency anemia type   2. MGUS (monoclonal gammopathy of unknown significance)      Dr. Randa Evens, MD, MPH [facility] [phone] 10/10/2021 4:45 PM

## 2021-10-12 LAB — MULTIPLE MYELOMA PANEL, SERUM
Albumin SerPl Elph-Mcnc: 3.7 g/dL (ref 2.9–4.4)
Albumin/Glob SerPl: 1.4 (ref 0.7–1.7)
Alpha 1: 0.1 g/dL (ref 0.0–0.4)
Alpha2 Glob SerPl Elph-Mcnc: 0.9 g/dL (ref 0.4–1.0)
B-Globulin SerPl Elph-Mcnc: 1.1 g/dL (ref 0.7–1.3)
Gamma Glob SerPl Elph-Mcnc: 0.6 g/dL (ref 0.4–1.8)
Globulin, Total: 2.7 g/dL (ref 2.2–3.9)
IgA: 611 mg/dL — ABNORMAL HIGH (ref 61–437)
IgG (Immunoglobin G), Serum: 699 mg/dL (ref 603–1613)
IgM (Immunoglobulin M), Srm: 52 mg/dL (ref 20–172)
M Protein SerPl Elph-Mcnc: 0.4 g/dL — ABNORMAL HIGH
Total Protein ELP: 6.4 g/dL (ref 6.0–8.5)

## 2022-03-20 IMAGING — NM NM HEPATO W/GB/PHARM/[PERSON_NAME]
2 series · 12 of 12 positions shown · non-contrast
Comparison: Ultrasound 11/03/2020

CLINICAL DATA: Right upper quadrant pain

EXAM:
NUCLEAR MEDICINE HEPATOBILIARY IMAGING WITH GALLBLADDER EF
TECHNIQUE: Sequential images of the abdomen were obtained [DATE] minutes
following intravenous administration of radiopharmaceutical. After
oral ingestion of Ensure, gallbladder ejection fraction was
determined. At 60 min, normal ejection fraction is greater than 33%.
RADIOPHARMACEUTICALS:  5.35 mCi Xc-WWm  Choletec IV

[Series 1000: hepatobiliary scan · 9.59mm/px · 6 of 60 frames shown]
[frame 6/60]
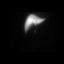
[frame 16/60]
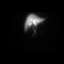
[frame 26/60]
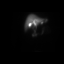
[frame 36/60]
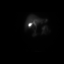
[frame 46/60]
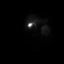
[frame 56/60]
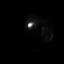

[Series 1000: gallbladder ef · 4.80mm/px · 6 of 120 frames shown]
[frame 11/120]
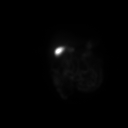
[frame 31/120]
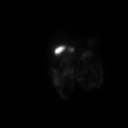
[frame 51/120]
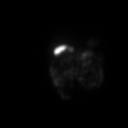
[frame 71/120]
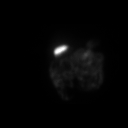
[frame 91/120]
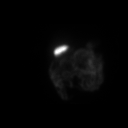
[frame 111/120]
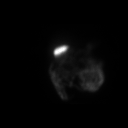

[12 of 12 positions shown; findings below may reference images not displayed]

FINDINGS: Prompt uptake and biliary excretion of activity by the liver is
seen. Gallbladder activity is visualized, consistent with patency of
cystic duct. Biliary activity passes into small bowel, consistent
with patent common bile duct.

Calculated gallbladder ejection fraction is 30%. (Normal gallbladder
ejection fraction with Ensure is greater than 33%.)
IMPRESSION: 1. Negative for acute gallbladder disease.
2. Decreased gallbladder ejection fraction of 30%, this can be seen
with functional gallbladder disease in the appropriate clinical
setting

## 2022-03-20 IMAGING — US US ABDOMEN LIMITED
1 series · 14 of 25 positions shown · non-contrast
Comparison: None.

CLINICAL DATA: Right upper quadrant pain

EXAM:
ULTRASOUND ABDOMEN LIMITED RIGHT UPPER QUADRANT

[Series 1: us abdomen limited ruq (liver/gb) · 14 of 55 slices shown]
[im 1/55]
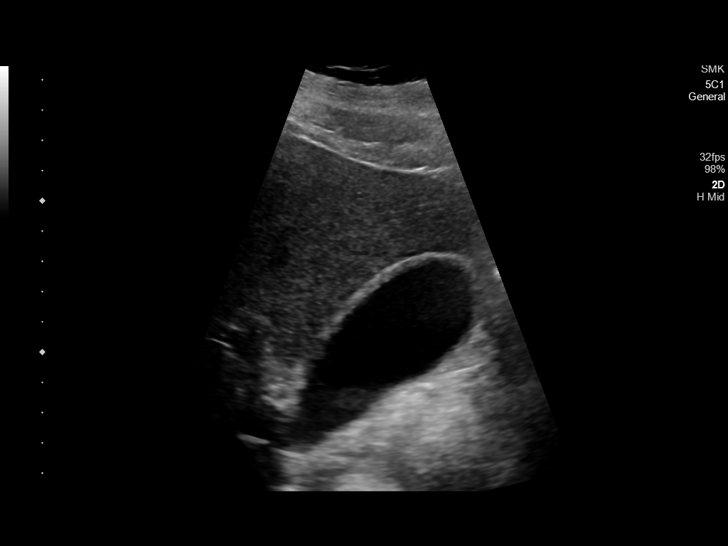
[im 5/55]
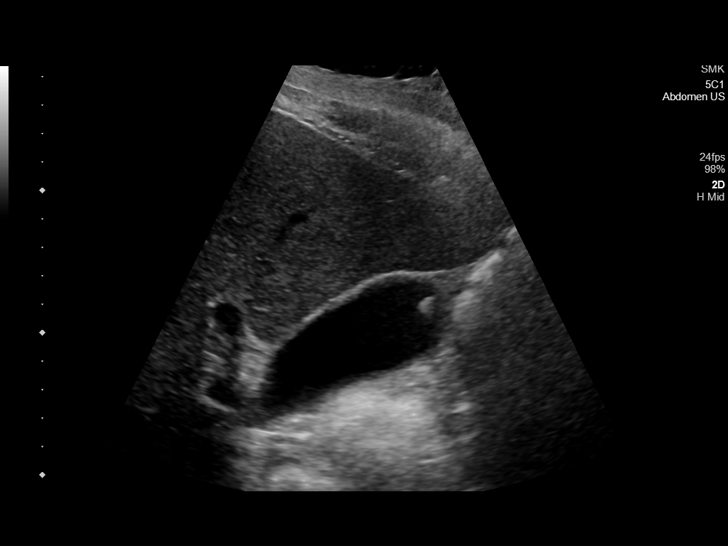
[im 10/55]
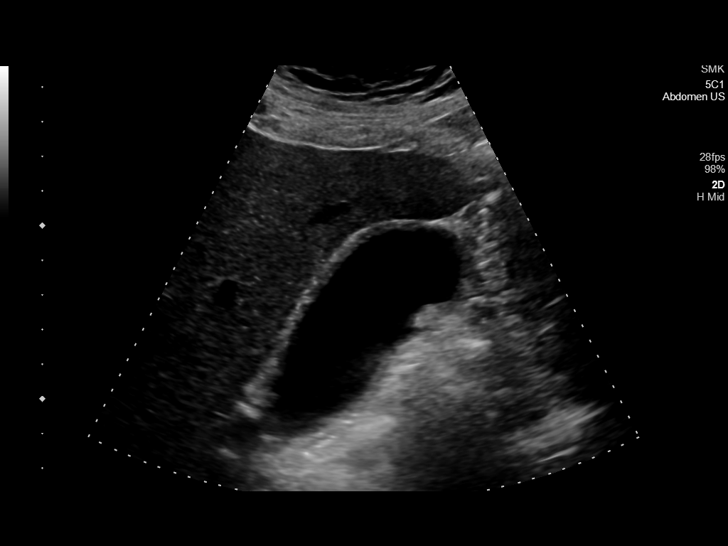
[im 14/55]
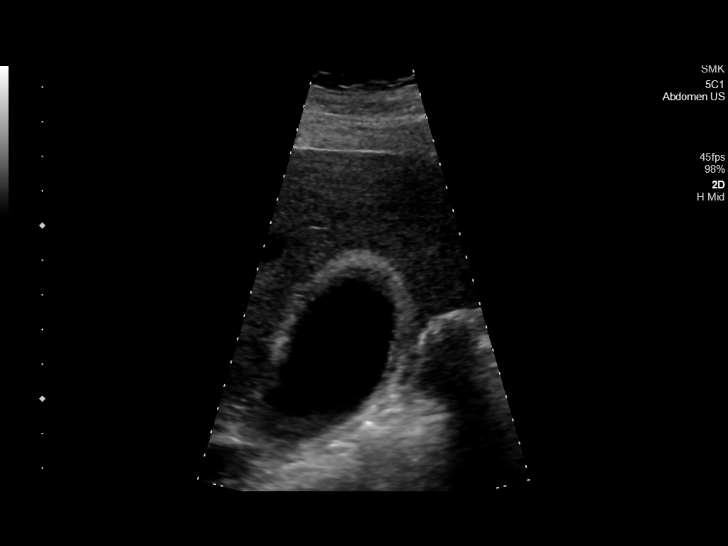
[im 19/55]
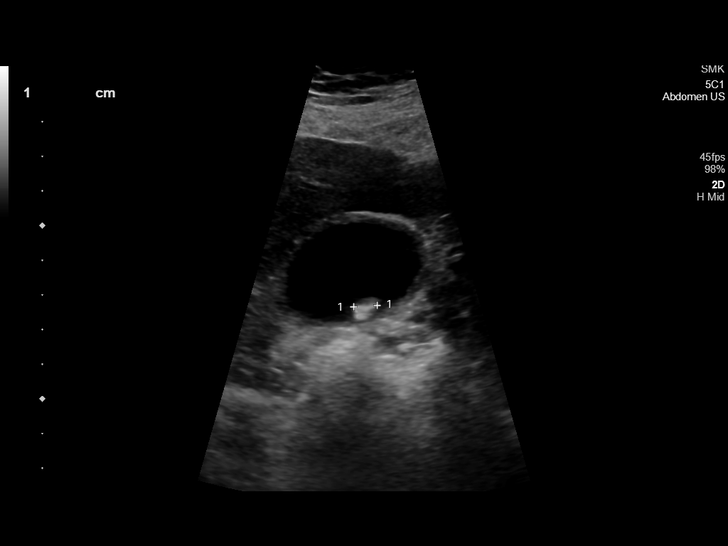
[im 21/55]
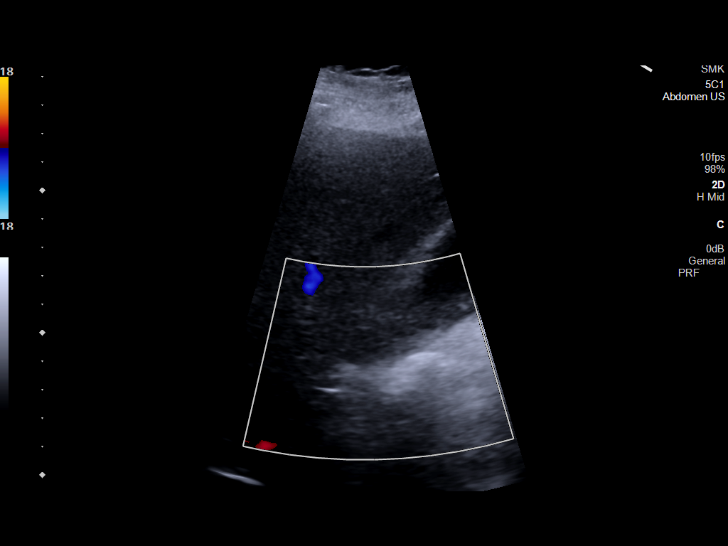
[im 25/55]
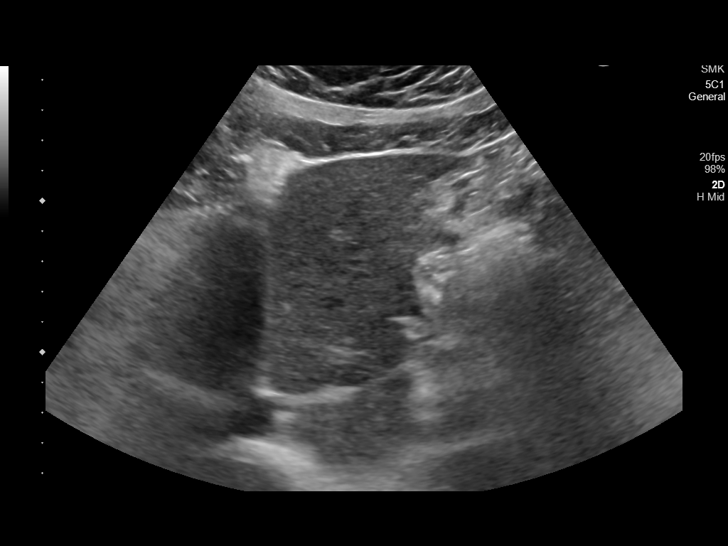
[im 30/55]
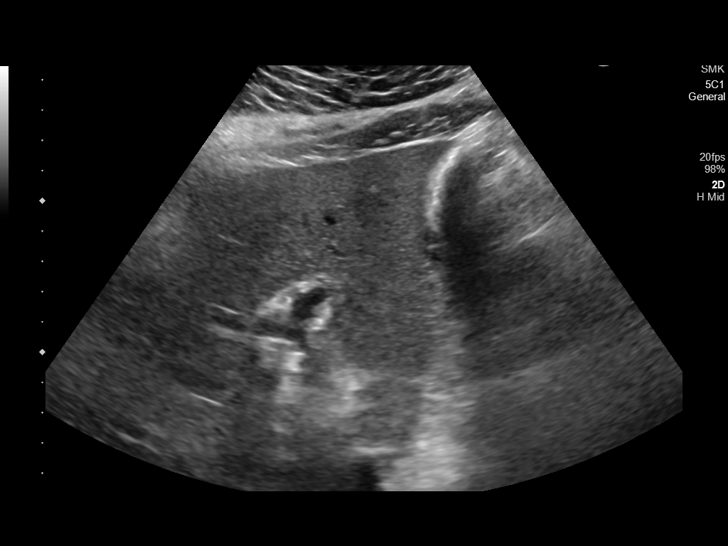
[im 34/55]
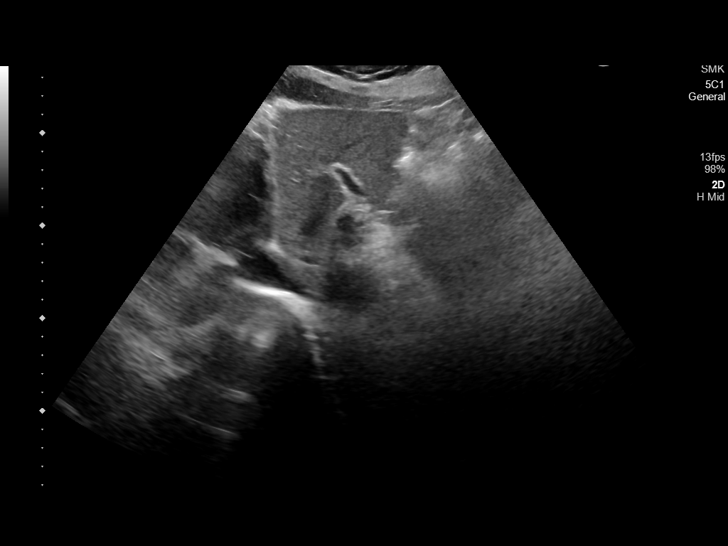
[im 37/55]
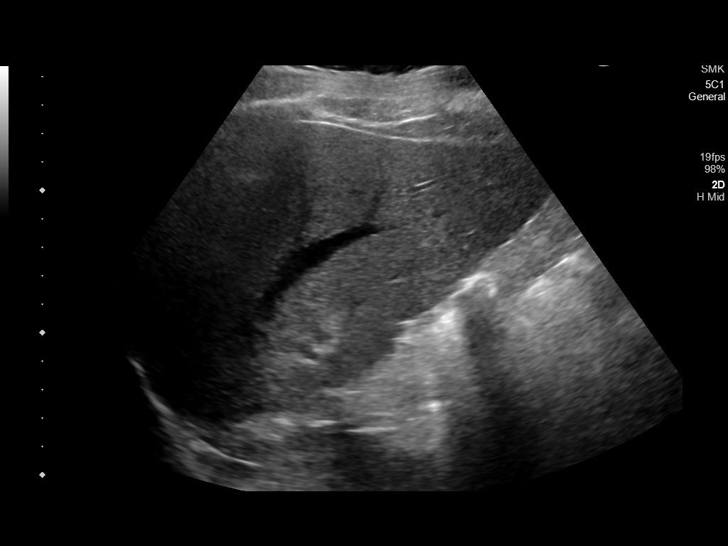
[im 41/55]
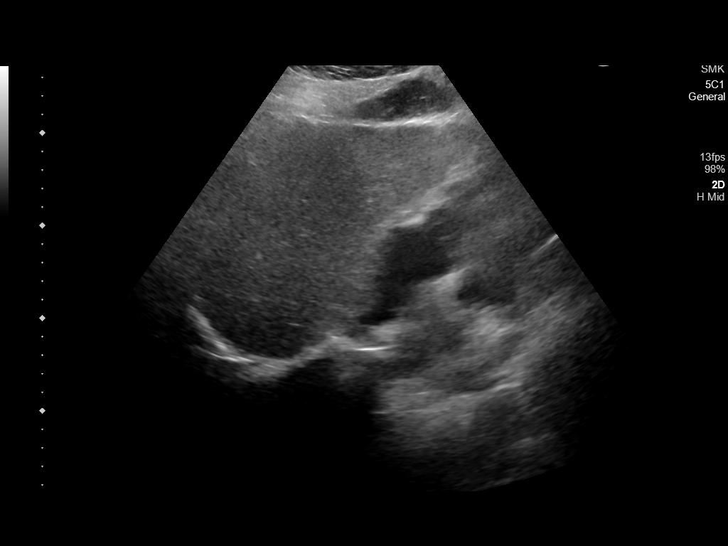
[im 46/55]
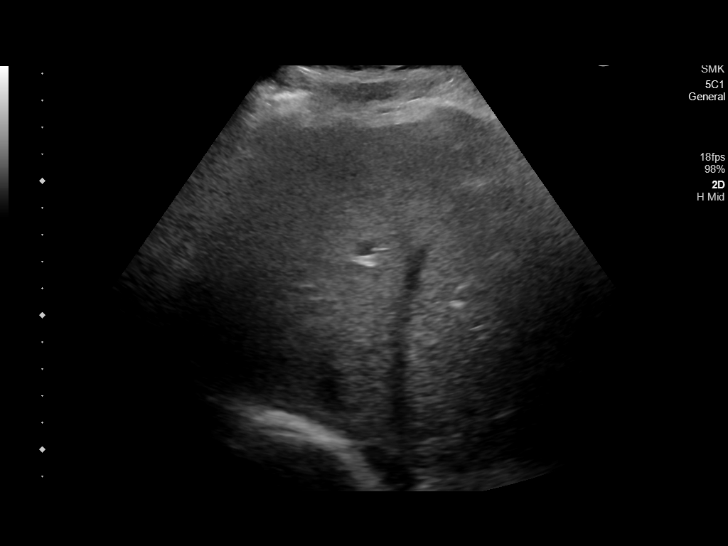
[im 50/55]
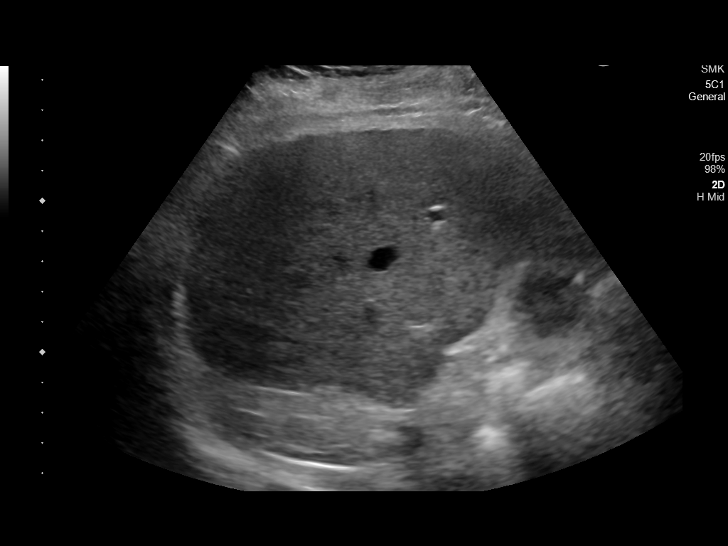
[im 55/55]
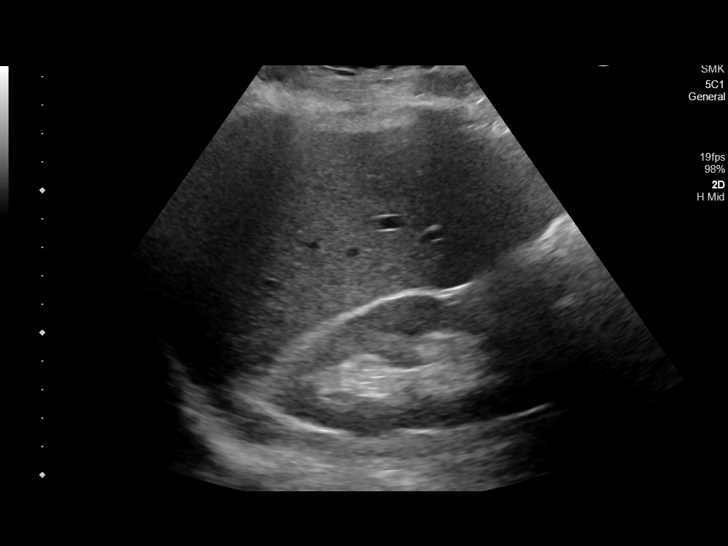

[14 of 25 positions shown; findings below may reference images not displayed]

FINDINGS: Gallbladder:

Small amount of gallbladder sludge with small stones. Borderline to
slight increased gallbladder wall thickness of 3.3 mm. Negative
sonographic Murphy.

Common bile duct:

Diameter: 5.3 mm

Liver:

No focal lesion identified. Within normal limits in parenchymal
echogenicity. Portal vein is patent on color Doppler imaging with
normal direction of blood flow towards the liver.

Other: None.
IMPRESSION: Small amount of sludge and stones in the gallbladder with borderline
to slight increased wall thickness but negative sonographic Murphy.
Findings are nonspecific but can be seen with acute or chronic
cholecystitis, liver disease, or edema forming states. See
separately dictated hepatobiliary nuclear medicine scan.

## 2022-04-11 ENCOUNTER — Encounter: Payer: Self-pay | Admitting: Oncology

## 2022-04-11 ENCOUNTER — Inpatient Hospital Stay: Payer: BC Managed Care – PPO | Attending: Oncology | Admitting: Oncology

## 2022-04-11 ENCOUNTER — Inpatient Hospital Stay: Payer: BC Managed Care – PPO
